# Patient Record
Sex: Female | Born: 1990
Health system: Southern US, Community
[De-identification: ages and names within clinical notes are randomized; demographics above are authoritative.]

## PROBLEM LIST (undated history)

## (undated) DIAGNOSIS — O039 Complete or unspecified spontaneous abortion without complication: Secondary | ICD-10-CM

## (undated) DIAGNOSIS — Z973 Presence of spectacles and contact lenses: Secondary | ICD-10-CM

## (undated) DIAGNOSIS — IMO0001 Reserved for inherently not codable concepts without codable children: Secondary | ICD-10-CM

## (undated) DIAGNOSIS — K219 Gastro-esophageal reflux disease without esophagitis: Secondary | ICD-10-CM

## (undated) HISTORY — DX: Complete or unspecified spontaneous abortion without complication: O03.9

## (undated) HISTORY — PX: HAND SURGERY: SHX662

## (undated) HISTORY — DX: Reserved for inherently not codable concepts without codable children: IMO0001

---
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
## 2010-08-14 HISTORY — PX: CYST EXCISION: SHX5701

## 2010-11-15 ENCOUNTER — Ambulatory Visit: Payer: Self-pay | Admitting: Specialist

## 2010-11-22 ENCOUNTER — Ambulatory Visit: Payer: Self-pay | Admitting: Specialist

## 2013-09-04 ENCOUNTER — Emergency Department: Payer: Self-pay | Admitting: Emergency Medicine

## 2013-09-04 LAB — CBC WITH DIFFERENTIAL/PLATELET
Basophil #: 0.1 10*3/uL (ref 0.0–0.1)
Basophil %: 1.1 %
Eosinophil #: 0.1 10*3/uL (ref 0.0–0.7)
Eosinophil %: 1.7 %
HCT: 41.7 % (ref 35.0–47.0)
HGB: 13.5 g/dL (ref 12.0–16.0)
Lymphocyte #: 2.3 10*3/uL (ref 1.0–3.6)
Lymphocyte %: 29.4 %
MCH: 28.6 pg (ref 26.0–34.0)
MCHC: 32.4 g/dL (ref 32.0–36.0)
MCV: 88 fL (ref 80–100)
Monocyte #: 0.7 x10 3/mm (ref 0.2–0.9)
Monocyte %: 8.7 %
NEUTROS ABS: 4.7 10*3/uL (ref 1.4–6.5)
NEUTROS PCT: 59.1 %
Platelet: 164 10*3/uL (ref 150–440)
RBC: 4.73 10*6/uL (ref 3.80–5.20)
RDW: 12.9 % (ref 11.5–14.5)
WBC: 7.9 10*3/uL (ref 3.6–11.0)

## 2013-09-04 LAB — URINALYSIS, COMPLETE
Bilirubin,UR: NEGATIVE
Glucose,UR: NEGATIVE mg/dL (ref 0–75)
KETONE: NEGATIVE
Nitrite: NEGATIVE
Ph: 6 (ref 4.5–8.0)
RBC,UR: 43 /HPF (ref 0–5)
Specific Gravity: 1.02 (ref 1.003–1.030)

## 2014-02-27 ENCOUNTER — Ambulatory Visit: Payer: Self-pay | Admitting: Physician Assistant

## 2014-03-14 ENCOUNTER — Emergency Department: Payer: Self-pay | Admitting: Emergency Medicine

## 2014-05-09 ENCOUNTER — Emergency Department: Payer: Self-pay | Admitting: Emergency Medicine

## 2015-07-26 ENCOUNTER — Encounter: Payer: Self-pay | Admitting: Emergency Medicine

## 2015-07-26 ENCOUNTER — Ambulatory Visit
Admission: EM | Admit: 2015-07-26 | Discharge: 2015-07-26 | Disposition: A | Discharge status: Home / Self Care | Payer: BLUE CROSS/BLUE SHIELD | Attending: Family Medicine | Admitting: Family Medicine

## 2015-07-26 DIAGNOSIS — S0501XA Injury of conjunctiva and corneal abrasion without foreign body, right eye, initial encounter: Secondary | ICD-10-CM

## 2015-07-26 DIAGNOSIS — H109 Unspecified conjunctivitis: Secondary | ICD-10-CM

## 2015-07-26 MED ORDER — FLUORESCEIN SODIUM 1 MG OP STRP: 1.0000 | ORAL_STRIP | Freq: Once | OPHTHALMIC | Status: DC

## 2015-07-26 MED ORDER — TETRACAINE HCL 0.5 % OP SOLN: 2.0000 [drp] | Freq: Once | OPHTHALMIC | Status: DC

## 2015-07-26 MED ORDER — ERYTHROMYCIN 5 MG/GM OP OINT: 1.0000 "application " | TOPICAL_OINTMENT | Freq: Four times a day (QID) | OPHTHALMIC | Status: DC

## 2015-07-26 NOTE — ED Provider Notes (Signed)
[facility]  ____________________________________________  Time seen: Approximately 3:46 PM  I have reviewed the triage vital signs and the nursing notes.   HISTORY  Chief Complaint Eye Drainage   HPI Summer Powers is a 24 y.o. femalepresents for complaints of right eye redness, drainage and itching x two days. States works in [employer], [employer] and frequently exposed to sick patients. States "I think I have pink eye."   Reports woke up with some redness and itching yesterday morning that gradually increased and reports last night went to take contact out, and states after taking contact out had some eye pain. States unsure if she scratched eye with nails or not. States no vision changes. States uses contacts and states when contact still in place, vision was normal. Reports does not have glasses with her at this time. Reports woke up this am with greenish right eye crusting and drainage with some matting. States intermittent drainage throughout the day today.   States current right eye irritation is 3/10. Denies other pain or discomfort. States some light sensitivity. Denies chemical exposure, foreign bodies, or injury. Denies recent sickness.    History reviewed. No pertinent past medical history.  There are no active problems to display for this patient.   Past Surgical History  Procedure Laterality Date  . Hand surgery Right    LMP: 1 week ago; denies chance of pregnancy.  Reports tetanus immunization is up to date.   Current Outpatient Rx  Name  Route  Sig  Dispense  Refill  .             Allergies Review of patient's allergies indicates not on file.  History reviewed. No pertinent family history.  Social History Social History  Substance Use Topics  . Smoking status: Current Every Day Smoker    Types: Cigarettes  . Smokeless tobacco: None  . Alcohol Use: Yes    Review of Systems Constitutional: No fever/chills Eyes: No visual changes. Right eye redness  and irritation with drainage.  ENT: No sore throat. Cardiovascular: Denies chest pain. Respiratory: Denies shortness of breath. Gastrointestinal: No abdominal pain.  No nausea, no vomiting.  No diarrhea.  No constipation. Genitourinary: Negative for dysuria. Musculoskeletal: Negative for back pain. Skin: Negative for rash. Neurological: Negative for headaches, focal weakness or numbness.  10-point ROS otherwise negative.  ____________________________________________   PHYSICAL EXAM:  VITAL SIGNS: ED Triage Vitals  Enc Vitals Group     BP 07/26/15 1424 114/79 mmHg     Pulse Rate 07/26/15 1424 72     Resp 07/26/15 1424 16     Temp 07/26/15 1424 97.7 F (36.5 C)     Temp Source 07/26/15 1424 Tympanic     SpO2 07/26/15 1424 100 %     Weight 07/26/15 1424 168 lb (76.204 kg)     Height 07/26/15 1424 5\' 2"  (1.575 m)     Head Cir --      Peak Flow --      Pain Score 07/26/15 1427 3     Pain Loc --      Pain Edu? --      Excl. in GC? --    Visual acuity: Right: 20/200 without correction            Left: 20/25 with correction.   Constitutional: Alert and oriented. Well appearing and in no acute distress. Eyes: PERRL. EOMI. No globe trauma; Eyelids normal to inspection; Everted for exam right, no foreign bodies visualized; Right conjunctiva mild  injection;  left conjunctiva normal; Right mild greenish exudate noted, mild greenish crusting present;  Examined with fluorescein right with corneal abrasion noted; EOM's intact; Pupils PERRLA; Anterior Chambers normal with limited exam.  Head: Atraumatic. No erythema, no swelling.   Ears: no erythema, normal TMs bilaterally.   Nose: No congestion/rhinnorhea.  Mouth/Throat: Mucous membranes are moist.  Oropharynx non-erythematous. Neck: No stridor.  No cervical spine tenderness to palpation. Hematological/Lymphatic/Immunilogical: No cervical lymphadenopathy. Cardiovascular: Normal rate, regular rhythm. Grossly normal heart sounds.  Good  peripheral circulation. Respiratory: Normal respiratory effort.  No retractions. Lungs CTAB. Gastrointestinal: Soft and nontender.  Musculoskeletal: No lower or upper extremity tenderness nor edema.   Neurologic:  Normal speech and language. No gross focal neurologic deficits are appreciated. No gait instability. Skin:  Skin is warm, dry and intact. No rash noted. Long finger nails.  Psychiatric: Mood and affect are normal. Speech and behavior are normal.  ____________________________________________   LABS (all labs ordered are listed, but only abnormal results are displayed)  Labs Reviewed - No data to display  PROCEDURES  Procedure(s) performed:   Eye exam Procedure explained and verbal consent obtained.  Anesthesia: tetracaine ophthalmic 2 drops Right eye examined with fluorescein strip.  No foreign bodies visualized. Right small corneal abrasion noted at 11 o'clock. No visualized foreign bodies, no rust ring visualized.  Sterile swab for blind sweep utilized,  no foreign bodies found.  Patient tolerated well.   ____________________________________________   INITIAL IMPRESSION / ASSESSMENT AND PLAN / ED COURSE   Pertinent labs & imaging results that were available during my care of the patient were reviewed by me and considered in my medical decision making (see chart for details).  Very well appearing patient, no acute distress. 2 days of right eye redness and itching with drainage. Denies known trigger, injury, chemicals or foreign bodies. States irritation increased post removing contact last night. Suspect right eye conjunctivitis with secondary corneal abrasion from removing contact. Will treat with erythromycin ophthalmic ointment, hand hygiene and close follow up. Counseled no contact use. Counseled follow up with opthalmology tomorrow. States she follows with Dr Clearance Coots and will follow up tomorrow. On call Dr. Druscilla Brownie information also given.   Discussed follow up with  Primary care physician this week. Discussed follow up and return parameters to Urgent care or ER including vision changes, pain, no resolution or any worsening concerns. Patient verbalized understanding and agreed to plan.   ____________________________________________   FINAL CLINICAL IMPRESSION(S) / ED DIAGNOSES  Final diagnoses:  Right conjunctivitis  Corneal abrasion, right, initial encounter     Renford Dills, NP 07/26/15 1828

## 2015-07-26 NOTE — Discharge Instructions (Signed)
Use medication as prescribed. Avoid rubbing or scratching eye. Do not use contact until resolved. Use good hand hygiene.   Follow up with your ophthalmologist or the above tomorrow. Return to Urgent care or go to ER for eye pain, vision changes, new or worsening concerns.   Corneal Abrasion The cornea is the clear covering at the front and center of the eye. When looking at the colored portion of the eye (iris), you are looking through the cornea. This very thin tissue is made up of many layers. The surface layer is a single layer of cells (corneal epithelium) and is one of the most sensitive tissues in the body. If a scratch or injury causes the corneal epithelium to come off, it is called a corneal abrasion. If the injury extends to the tissues below the epithelium, the condition is called a corneal ulcer. CAUSES   Scratches.  Trauma.  Foreign body in the eye. Some people have recurrences of abrasions in the area of the original injury even after it has healed (recurrent erosion syndrome). Recurrent erosion syndrome generally improves and goes away with time. SYMPTOMS   Eye pain.  Difficulty or inability to keep the injured eye open.  The eye becomes very sensitive to light.  Recurrent erosions tend to happen suddenly, first thing in the morning, usually after waking up and opening the eye. DIAGNOSIS  Your health care provider can diagnose a corneal abrasion during an eye exam. Dye is usually placed in the eye using a drop or a small paper strip moistened by your tears. When the eye is examined with a special light, the abrasion shows up clearly because of the dye. TREATMENT   Small abrasions may be treated with antibiotic drops or ointment alone.  A pressure patch may be put over the eye. If this is done, follow your doctor's instructions for when to remove the patch. Do not drive or use machines while the eye patch is on. Judging distances is hard to do with a patch on. If the  abrasion becomes infected and spreads to the deeper tissues of the cornea, a corneal ulcer can result. This is serious because it can cause corneal scarring. Corneal scars interfere with light passing through the cornea and cause a loss of vision in the involved eye. HOME CARE INSTRUCTIONS  Use medicine or ointment as directed. Only take over-the-counter or prescription medicines for pain, discomfort, or fever as directed by your health care provider.  Do not drive or operate machinery if your eye is patched. Your ability to judge distances is impaired.  If your health care provider has given you a follow-up appointment, it is very important to keep that appointment. Not keeping the appointment could result in a severe eye infection or permanent loss of vision. If there is any problem keeping the appointment, let your health care provider know. SEEK MEDICAL CARE IF:   You have pain, light sensitivity, and a scratchy feeling in one eye or both eyes.  Your pressure patch keeps loosening up, and you can blink your eye under the patch after treatment.  Any kind of discharge develops from the eye after treatment or if the lids stick together in the morning.  You have the same symptoms in the morning as you did with the original abrasion days, weeks, or months after the abrasion healed.   This information is not intended to replace advice given to you by your health care provider. Make sure you discuss any questions you have  with your health care provider.   Document Released: 07/28/2000 Document Revised: 04/21/2015 Document Reviewed: 04/07/2013 Elsevier Interactive Patient Education 2016 Elsevier Inc.  Bacterial Conjunctivitis Bacterial conjunctivitis, commonly called pink eye, is an inflammation of the clear membrane that covers the white part of the eye (conjunctiva). The inflammation can also happen on the underside of the eyelids. The blood vessels in the conjunctiva become inflamed, causing  the eye to become red or pink. Bacterial conjunctivitis may spread easily from one eye to another and from person to person (contagious).  CAUSES  Bacterial conjunctivitis is caused by bacteria. The bacteria may come from your own skin, your upper respiratory tract, or from someone else with bacterial conjunctivitis. SYMPTOMS  The normally white color of the eye or the underside of the eyelid is usually pink or red. The pink eye is usually associated with irritation, tearing, and some sensitivity to light. Bacterial conjunctivitis is often associated with a thick, yellowish discharge from the eye. The discharge may turn into a crust on the eyelids overnight, which causes your eyelids to stick together. If a discharge is present, there may also be some blurred vision in the affected eye. DIAGNOSIS  Bacterial conjunctivitis is diagnosed by your caregiver through an eye exam and the symptoms that you report. Your caregiver looks for changes in the surface tissues of your eyes, which may point to the specific type of conjunctivitis. A sample of any discharge may be collected on a cotton-tip swab if you have a severe case of conjunctivitis, if your cornea is affected, or if you keep getting repeat infections that do not respond to treatment. The sample will be sent to a lab to see if the inflammation is caused by a bacterial infection and to see if the infection will respond to antibiotic medicines. TREATMENT   Bacterial conjunctivitis is treated with antibiotics. Antibiotic eyedrops are most often used. However, antibiotic ointments are also available. Antibiotics pills are sometimes used. Artificial tears or eye washes may ease discomfort. HOME CARE INSTRUCTIONS   To ease discomfort, apply a cool, clean washcloth to your eye for 10-20 minutes, 3-4 times a day.  Gently wipe away any drainage from your eye with a warm, wet washcloth or a cotton ball.  Wash your hands often with soap and water. Use paper  towels to dry your hands.  Do not share towels or washcloths. This may spread the infection.  Change or wash your pillowcase every day.  You should not use eye makeup until the infection is gone.  Do not operate machinery or drive if your vision is blurred.  Stop using contact lenses. Ask your caregiver how to sterilize or replace your contacts before using them again. This depends on the type of contact lenses that you use.  When applying medicine to the infected eye, do not touch the edge of your eyelid with the eyedrop bottle or ointment tube. SEEK IMMEDIATE MEDICAL CARE IF:   Your infection has not improved within 3 days after beginning treatment.  You had yellow discharge from your eye and it returns.  You have increased eye pain.  Your eye redness is spreading.  Your vision becomes blurred.  You have a fever or persistent symptoms for more than 2-3 days.  You have a fever and your symptoms suddenly get worse.  You have facial pain, redness, or swelling. MAKE SURE YOU:   Understand these instructions.  Will watch your condition.  Will get help right away if you are not doing  well or get worse.   This information is not intended to replace advice given to you by your health care provider. Make sure you discuss any questions you have with your health care provider.   Document Released: 07/31/2005 Document Revised: 08/21/2014 Document Reviewed: 01/01/2012 Elsevier Interactive Patient Education [employer]2016 Elsevier Inc.

## 2015-07-26 NOTE — ED Notes (Signed)
Patient c/o redness and drainage from her right eye since yesterday.

## 2015-08-16 LAB — HM PAP SMEAR: HM Pap smear: NORMAL

## 2015-10-07 ENCOUNTER — Encounter: Payer: Self-pay | Admitting: Emergency Medicine

## 2015-10-07 ENCOUNTER — Emergency Department
Admission: EM | Admit: 2015-10-07 | Discharge: 2015-10-07 | Disposition: A | Discharge status: Home / Self Care | Payer: BLUE CROSS/BLUE SHIELD | Attending: Emergency Medicine | Admitting: Emergency Medicine

## 2015-10-07 DIAGNOSIS — J069 Acute upper respiratory infection, unspecified: Secondary | ICD-10-CM | POA: Insufficient documentation

## 2015-10-07 DIAGNOSIS — F17210 Nicotine dependence, cigarettes, uncomplicated: Secondary | ICD-10-CM | POA: Diagnosis not present

## 2015-10-07 DIAGNOSIS — R51 Headache: Secondary | ICD-10-CM | POA: Diagnosis present

## 2015-10-07 LAB — RAPID INFLUENZA A&B ANTIGENS
Influenza A (ARMC): NOT DETECTED
Influenza B (ARMC): NOT DETECTED

## 2015-10-07 MED ORDER — IBUPROFEN 400 MG PO TABS
400.0000 mg | ORAL_TABLET | Freq: Once | ORAL | Status: AC
  Administered 2015-10-07: 400 mg via ORAL
  Filled 2015-10-07: qty 1

## 2015-10-07 MED ORDER — HYDROCOD POLST-CPM POLST ER 10-8 MG/5ML PO SUER: 5.0000 mL | Freq: Two times a day (BID) | ORAL | Status: DC

## 2015-10-07 NOTE — ED Notes (Signed)
Patient reports chills, headache and generalized body aches that started yesterday. Patient reports that she had been exposed to the flu.

## 2015-10-07 NOTE — ED Provider Notes (Signed)
[facility] Emergency Department Provider Note     Time seen: ----------------------------------------- 8:45 PM on 10/07/2015 -----------------------------------------    I have reviewed the triage vital signs and the nursing notes.   HISTORY  Chief Complaint Chills; Headache; and Generalized Body Aches    HPI Summer Powers is a 25 y.o. female who presents ER with chills, headaches, sore throat and congestion that started yesterday. Patient thinks she was exposed to flu through her work. She denies any nausea vomiting or diarrhea. Nothing makes it better or worse.   History reviewed. No pertinent past medical history.  There are no active problems to display for this patient.   Past Surgical History  Procedure Laterality Date  . Hand surgery Right     Allergies Review of patient's allergies indicates no known allergies.  Social History Social History  Substance Use Topics  . Smoking status: Current Every Day Smoker -- 1.00 packs/day for 3 years    Types: Cigarettes  . Smokeless tobacco: None  . Alcohol Use: Yes     Comment: occasionally    Review of Systems Constitutional: Negative for fever. Positive for chills Eyes: Negative for visual changes. ENT:  Positive for sore throat and congestion Cardiovascular: Negative for chest pain. Respiratory: Negative for shortness of breath. Gastrointestinal: Negative for abdominal pain, vomiting and diarrhea. Genitourinary: Negative for dysuria. Musculoskeletal: Negative for back pain. Skin: Negative for rash. Neurological: As if her headache  10-point ROS otherwise negative.  ____________________________________________   PHYSICAL EXAM:  VITAL SIGNS: ED Triage Vitals  Enc Vitals Group     BP 10/07/15 2005 121/69 mmHg     Pulse Rate 10/07/15 2005 91     Resp 10/07/15 2005 18     Temp 10/07/15 2005 98.2 F (36.8 C)     Temp Source 10/07/15 2005 Oral     SpO2 10/07/15 2005 98 %   Weight 10/07/15 2005 168 lb (76.204 kg)     Height 10/07/15 2005  (1.575 m)     Head Cir --      Peak Flow --      Pain Score 10/07/15 2005 5     Pain Loc --      Pain Edu? --      Excl. in GC? --     Constitutional: Alert and oriented. Well appearing and in no distress. Eyes: Conjunctivae are normal. PERRL. Normal extraocular movements. ENT   Head: Normocephalic and atraumatic.   Nose: No congestion/rhinnorhea.   Mouth/Throat: Mucous membranes are moist.   Neck: No stridor. Cardiovascular: Normal rate, regular rhythm. Normal and symmetric distal pulses are present in all extremities. No murmurs, rubs, or gallops. Respiratory: Normal respiratory effort without tachypnea nor retractions. Breath sounds are clear and equal bilaterally. No wheezes/rales/rhonchi. Gastrointestinal: Soft and nontender. No distention. No abdominal bruits.  Musculoskeletal: Nontender with normal range of motion in all extremities. No joint effusions.  No lower extremity tenderness nor edema. Neurologic:  Normal speech and language. No gross focal neurologic deficits are appreciated. Speech is normal. No gait instability. Skin:  Skin is warm, dry and intact. No rash noted. Psychiatric: Mood and affect are normal. Speech and behavior are normal. Patient exhibits appropriate insight and judgment. ____________________________________________  ED COURSE:  Pertinent labs & imaging results that were available during my care of the patient were reviewed by me and considered in my medical decision making (see chart for details). Clinically patient is doubtful for the flu, will obtain a flu swab and reevaluate.  ____________________________________________    LABS (pertinent positives/negatives)  Labs Reviewed  RAPID INFLUENZA A&B ANTIGENS ([facility] ONLY)   ____________________________________________  FINAL ASSESSMENT AND PLAN  URI  Plan: Patient with labs and imaging as dictated above. Patient  be discharged with decongestants and cough medicine and encouraged to have close follow-up with her doctor for reevaluation.   Emily Filbert, MD   Emily Filbert, MD 10/07/15 [phone]

## 2015-10-07 NOTE — ED Notes (Signed)
Pt. Going home by self. 

## 2015-10-07 NOTE — Discharge Instructions (Signed)
Upper Respiratory Infection, Adult Most upper respiratory infections (URIs) are a viral infection of the air passages leading to the lungs. A URI affects the nose, throat, and upper air passages. The most common type of URI is nasopharyngitis and is typically referred to as "the common cold." URIs run their course and usually go away on their own. Most of the time, a URI does not require medical attention, but sometimes a bacterial infection in the upper airways can follow a viral infection. This is called a secondary infection. Sinus and middle ear infections are common types of secondary upper respiratory infections. Bacterial pneumonia can also complicate a URI. A URI can worsen asthma and chronic obstructive pulmonary disease (COPD). Sometimes, these complications can require emergency medical care and may be life threatening.  CAUSES Almost all URIs are caused by viruses. A virus is a type of germ and can spread from one person to another.  RISKS FACTORS You may be at risk for a URI if:   You smoke.   You have chronic heart or lung disease.  You have a weakened defense (immune) system.   You are very young or very old.   You have nasal allergies or asthma.  You work in crowded or poorly ventilated areas.  You work in health care facilities or schools. SIGNS AND SYMPTOMS  Symptoms typically develop 2-3 days after you come in contact with a cold virus. Most viral URIs last 7-10 days. However, viral URIs from the influenza virus (flu virus) can last 14-18 days and are typically more severe. Symptoms may include:   Runny or stuffy (congested) nose.   Sneezing.   Cough.   Sore throat.   Headache.   Fatigue.   Fever.   Loss of appetite.   Pain in your forehead, behind your eyes, and over your cheekbones (sinus pain).  Muscle aches.  DIAGNOSIS  Your health care provider may diagnose a URI by:  Physical exam.  Tests to check that your symptoms are not due to  another condition such as:  Strep throat.  Sinusitis.  Pneumonia.  Asthma. TREATMENT  A URI goes away on its own with time. It cannot be cured with medicines, but medicines may be prescribed or recommended to relieve symptoms. Medicines may help:  Reduce your fever.  Reduce your cough.  Relieve nasal congestion. HOME CARE INSTRUCTIONS   Take medicines only as directed by your health care provider.   Gargle warm saltwater or take cough drops to comfort your throat as directed by your health care provider.  Use a warm mist humidifier or inhale steam from a shower to increase air moisture. This may make it easier to breathe.  Drink enough fluid to keep your urine clear or pale yellow.   Eat soups and other clear broths and maintain good nutrition.   Rest as needed.   Return to work when your temperature has returned to normal or as your health care provider advises. You may need to stay home longer to avoid infecting others. You can also use a face mask and careful hand washing to prevent spread of the virus.  Increase the usage of your inhaler if you have asthma.   Do not use any tobacco products, including cigarettes, chewing tobacco, or electronic cigarettes. If you need help quitting, ask your health care provider. PREVENTION  The best way to protect yourself from getting a cold is to practice good hygiene.   Avoid oral or hand contact with people with cold   symptoms.   Wash your hands often if contact occurs.  There is no clear evidence that vitamin C, vitamin E, echinacea, or exercise reduces the chance of developing a cold. However, it is always recommended to get plenty of rest, exercise, and practice good nutrition.  SEEK MEDICAL CARE IF:   You are getting worse rather than better.   Your symptoms are not controlled by medicine.   You have chills.  You have worsening shortness of breath.  You have brown or red mucus.  You have yellow or brown nasal  discharge.  You have pain in your face, especially when you bend forward.  You have a fever.  You have swollen neck glands.  You have pain while swallowing.  You have white areas in the back of your throat. SEEK IMMEDIATE MEDICAL CARE IF:   You have severe or persistent:  Headache.  Ear pain.  Sinus pain.  Chest pain.  You have chronic lung disease and any of the following:  Wheezing.  Prolonged cough.  Coughing up blood.  A change in your usual mucus.  You have a stiff neck.  You have changes in your:  Vision.  Hearing.  Thinking.  Mood. MAKE SURE YOU:   Understand these instructions.  Will watch your condition.  Will get help right away if you are not doing well or get worse.   This information is not intended to replace advice given to you by your health care provider. Make sure you discuss any questions you have with your health care provider.   Document Released: 01/24/2001 Document Revised: 12/15/2014 Document Reviewed: 11/05/2013 Elsevier Interactive Patient Education 2016 Elsevier Inc.  

## 2015-10-07 NOTE — ED Notes (Signed)
Pt. States body aches and congestion for 2 days.  Pt. States no one is sick at home.

## 2016-01-24 ENCOUNTER — Encounter: Payer: Self-pay | Admitting: Emergency Medicine

## 2016-01-24 ENCOUNTER — Emergency Department: Payer: Medicaid Other

## 2016-01-24 ENCOUNTER — Emergency Department
Admission: EM | Admit: 2016-01-24 | Discharge: 2016-01-24 | Disposition: A | Discharge status: Home / Self Care | Payer: Medicaid Other | Attending: Emergency Medicine | Admitting: Emergency Medicine

## 2016-01-24 DIAGNOSIS — O209 Hemorrhage in early pregnancy, unspecified: Secondary | ICD-10-CM | POA: Diagnosis present

## 2016-01-24 DIAGNOSIS — Z3A08 8 weeks gestation of pregnancy: Secondary | ICD-10-CM | POA: Diagnosis not present

## 2016-01-24 DIAGNOSIS — Z79899 Other long term (current) drug therapy: Secondary | ICD-10-CM | POA: Diagnosis not present

## 2016-01-24 DIAGNOSIS — O200 Threatened abortion: Secondary | ICD-10-CM | POA: Insufficient documentation

## 2016-01-24 DIAGNOSIS — F17210 Nicotine dependence, cigarettes, uncomplicated: Secondary | ICD-10-CM | POA: Insufficient documentation

## 2016-01-24 DIAGNOSIS — Z792 Long term (current) use of antibiotics: Secondary | ICD-10-CM | POA: Diagnosis not present

## 2016-01-24 LAB — HCG, QUANTITATIVE, PREGNANCY: HCG, BETA CHAIN, QUANT, S: 296420 m[IU]/mL — AB (ref <5)

## 2016-01-24 LAB — POCT PREGNANCY, URINE: Preg Test, Ur: POSITIVE — AB

## 2016-01-24 LAB — ABO/RH: ABO/RH(D): A POS

## 2016-01-24 NOTE — ED Notes (Signed)
AAOx3.  Skin warm and dry. Ambulates with easy and steady gait. 

## 2016-01-24 NOTE — ED Notes (Signed)
Pt states she is eight weeks pregnant and has had some spotting. 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 Pt states she has to lift pts that lift 300+ lbs. Denies any abd cramping but has some lower back pain.

## 2016-01-24 NOTE — ED Provider Notes (Signed)
[facility] Emergency Department Provider Note  Time seen: 3:25 PM  I have reviewed the triage vital signs and the nursing notes.   HISTORY  Chief Complaint Vaginal Bleeding    HPI Summer Powers is a 25 y.o. female G1 P0 with no past medical history presents the emergency department for vaginal spotting. According to the patient she believes she is about 7 or [redacted] weeks pregnant based on her last menstrual period. The patient found out she was pregnant [redacted] weeks ago. 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 States today she has noted some very light spotting date after she uses the bathroom when she wipes she will have a very small amount of pink tinge on the toilet paper, she became concerned so she came to the department for evaluation. Denies any abdominal pain. Does state mild back pain but states that is fairly typical for her. Denies any dysuria, nausea, vomiting, diarrhea or fever. Describes the bleeding is very minimal/spotting.     History reviewed. No pertinent past medical history.  There are no active problems to display for this patient.   Past Surgical History  Procedure Laterality Date  . Hand surgery Right     Current Outpatient Rx  Name  Route  Sig  Dispense  Refill  . chlorpheniramine-HYDROcodone (TUSSIONEX PENNKINETIC ER) 10-8 MG/5ML SUER   Oral   Take 5 mLs by mouth 2 (two) times daily.   140 mL   0   . erythromycin ophthalmic ointment   Right Eye   Place 1 application into the right eye 4 (four) times daily. For seven days   3.5 g   0     Allergies Review of patient's allergies indicates no known allergies.  No family history on file.  Social History Social History  Substance Use Topics  . Smoking status: Current Every Day Smoker -- 1.00 packs/day for 3 years    Types: Cigarettes  . Smokeless tobacco: None  . Alcohol Use: Yes     Comment: occasionally    Review of Systems Constitutional:  Negative for fever. Cardiovascular: Negative for chest pain. Respiratory: Negative for shortness of breath. Gastrointestinal: Negative for abdominal pain Musculoskeletal: Negative for back pain. Neurological: Negative for headache 10-point ROS otherwise negative.  ____________________________________________   PHYSICAL EXAM:  VITAL SIGNS: ED Triage Vitals  Enc Vitals Group     BP 01/24/16 1304 121/72 mmHg     Pulse Rate 01/24/16 1304 85     Resp 01/24/16 1304 20     Temp 01/24/16 1304 98.2 F (36.8 C)     Temp Source 01/24/16 1304 Oral     SpO2 01/24/16 1304 100 %     Weight --      Height --      Head Cir --      Peak Flow --      Pain Score 01/24/16 1304 5     Pain Loc --      Pain Edu? --      Excl. in GC? --     Constitutional: Alert and oriented. Well appearing and in no distress. Eyes: Normal exam ENT   Head: Normocephalic and atraumatic   Mouth/Throat: Mucous membranes are moist. Cardiovascular: Normal rate, regular rhythm. No murmur Respiratory: Normal respiratory effort without tachypnea nor retractions. Breath sounds are clear  Gastrointestinal: Soft and nontender. No distention.   Musculoskeletal: Nontender with normal range of motion in all extremities Neurologic:  Normal  speech and language. No gross focal neurologic deficits Skin:  Skin is warm, dry and intact.  Psychiatric: Mood and affect are normal.   ____________________________________________    RADIOLOGY  Ultrasound consistent with 8 week 1 day IUP.  ____________________________________________    INITIAL IMPRESSION / ASSESSMENT AND PLAN / ED COURSE  Pertinent labs & imaging results that were available during my care of the patient were reviewed by me and considered in my medical decision making (see chart for details).  The patient presents the emergency department vaginal spotting. States she is pregnant 7 or [redacted] weeks pregnant based on her last menstrual period. Denies any  abdominal pain or pelvic pain. We will check labs, proceed with an ultrasound, and closely monitor in the emergency department.  Ultrasound consistent with a week 1 day IUP. We'll discharge with OB follow-up. Patient points follow up with [facility].  ____________________________________________   FINAL CLINICAL IMPRESSION(S) / ED DIAGNOSES  Threatened miscarriage   Minna AntisKevin Kayra Crowell, MD 01/24/16 [phone]

## 2016-01-24 NOTE — Discharge Instructions (Signed)

## 2016-08-02 LAB — HM PAP SMEAR: HM Pap smear: NEGATIVE

## 2016-08-03 ENCOUNTER — Other Ambulatory Visit: Payer: Self-pay | Admitting: Advanced Practice Midwife

## 2016-08-03 DIAGNOSIS — Z369 Encounter for antenatal screening, unspecified: Secondary | ICD-10-CM

## 2016-08-16 DIAGNOSIS — Z315 Encounter for genetic counseling: Secondary | ICD-10-CM

## 2016-08-16 HISTORY — DX: Encounter for procreative genetic counseling: Z31.5

## 2016-09-18 ENCOUNTER — Ambulatory Visit: Payer: BLUE CROSS/BLUE SHIELD

## 2017-03-22 DIAGNOSIS — Z418 Encounter for other procedures for purposes other than remedying health state: Secondary | ICD-10-CM | POA: Diagnosis not present

## 2017-03-22 DIAGNOSIS — Z113 Encounter for screening for infections with a predominantly sexual mode of transmission: Secondary | ICD-10-CM | POA: Diagnosis not present

## 2017-09-28 ENCOUNTER — Emergency Department
Admission: EM | Admit: 2017-09-28 | Discharge: 2017-09-28 | Discharge status: Against Medical Advice | Payer: BLUE CROSS/BLUE SHIELD

## 2017-09-28 NOTE — ED Triage Notes (Signed)
FIRST NURSE NOTE:  Pt with c/o vaginal bleeding and states "I think I might be pregnant."

## 2017-10-05 ENCOUNTER — Encounter: Payer: Self-pay | Admitting: Advanced Practice Midwife

## 2017-10-05 ENCOUNTER — Ambulatory Visit (INDEPENDENT_AMBULATORY_CARE_PROVIDER_SITE_OTHER): Payer: 59 | Admitting: Advanced Practice Midwife

## 2017-10-05 VITALS — BP 124/70 | Wt 173.0 lb

## 2017-10-05 DIAGNOSIS — O99210 Obesity complicating pregnancy, unspecified trimester: Secondary | ICD-10-CM | POA: Insufficient documentation

## 2017-10-05 DIAGNOSIS — O0990 Supervision of high risk pregnancy, unspecified, unspecified trimester: Secondary | ICD-10-CM | POA: Diagnosis not present

## 2017-10-05 DIAGNOSIS — Z6832 Body mass index (BMI) 32.0-32.9, adult: Secondary | ICD-10-CM

## 2017-10-05 DIAGNOSIS — N912 Amenorrhea, unspecified: Secondary | ICD-10-CM | POA: Diagnosis not present

## 2017-10-05 DIAGNOSIS — Z113 Encounter for screening for infections with a predominantly sexual mode of transmission: Secondary | ICD-10-CM | POA: Diagnosis not present

## 2017-10-05 DIAGNOSIS — Z131 Encounter for screening for diabetes mellitus: Secondary | ICD-10-CM

## 2017-10-05 DIAGNOSIS — Z1379 Encounter for other screening for genetic and chromosomal anomalies: Secondary | ICD-10-CM

## 2017-10-05 NOTE — Patient Instructions (Signed)
Prenatal Care WHAT IS PRENATAL CARE? Prenatal care is the process of caring for a pregnant woman before she gives birth. Prenatal care makes sure that she and her baby remain as healthy as possible throughout pregnancy. Prenatal care may be provided by a midwife, family practice health care provider, or a childbirth and pregnancy specialist (obstetrician). Prenatal care may include physical examinations, testing, treatments, and education on nutrition, lifestyle, and social support services. WHY IS PRENATAL CARE SO IMPORTANT? Early and consistent prenatal care increases the chance that you and your baby will remain healthy throughout your pregnancy. This type of care also decreases a baby's risk of being born too early (prematurely), or being born smaller than expected (small for gestational age). Any underlying medical conditions you may have that could pose a risk during your pregnancy are discussed during prenatal care visits. You will also be monitored regularly for any new conditions that may arise during your pregnancy so they can be treated quickly and effectively. WHAT HAPPENS DURING PRENATAL CARE VISITS? Prenatal care visits may include the following: Discussion Tell your health care provider about any new signs or symptoms you have experienced since your last visit. These might include:  Nausea or vomiting.  Increased or decreased level of energy.  Difficulty sleeping.  Back or leg pain.  Weight changes.  Frequent urination.  Shortness of breath with physical activity.  Changes in your skin, such as the development of a rash or itchiness.  Vaginal discharge or bleeding.  Feelings of excitement or nervousness.  Changes in your baby's movements.  You may want to write down any questions or topics you want to discuss with your health care provider and bring them with you to your appointment. Examination During your first prenatal care visit, you will likely have a complete  physical exam. Your health care provider will often examine your vagina, cervix, and the position of your uterus, as well as check your heart, lungs, and other body systems. As your pregnancy progresses, your health care provider will measure the size of your uterus and your baby's position inside your uterus. He or she may also examine you for early signs of labor. Your prenatal visits may also include checking your blood pressure and, after about 10-12 weeks of pregnancy, listening to your baby's heartbeat. Testing Regular testing often includes:  Urinalysis. This checks your urine for glucose, protein, or signs of infection.  Blood count. This checks the levels of white and red blood cells in your body.  Tests for sexually transmitted infections (STIs). Testing for STIs at the beginning of pregnancy is routinely done and is required in many states.  Antibody testing. You will be checked to see if you are immune to certain illnesses, such as rubella, that can affect a developing fetus.  Glucose screen. Around 24-28 weeks of pregnancy, your blood glucose level will be checked for signs of gestational diabetes. Follow-up tests may be recommended.  Group B strep. This is a bacteria that is commonly found inside a woman's vagina. This test will inform your health care provider if you need an antibiotic to reduce the amount of this bacteria in your body prior to labor and childbirth.  Ultrasound. Many pregnant women undergo an ultrasound screening around 18-20 weeks of pregnancy to evaluate the health of the fetus and check for any developmental abnormalities.  HIV (human immunodeficiency virus) testing. Early in your pregnancy, you will be screened for HIV. If you are at high risk for HIV, this test may   be repeated during your third trimester of pregnancy.  You may be offered other testing based on your age, personal or family medical history, or other factors. HOW OFTEN SHOULD I PLAN TO SEE MY  HEALTH CARE PROVIDER FOR PRENATAL CARE? Your prenatal care check-up schedule depends on any medical conditions you have before, or develop during, your pregnancy. If you do not have any underlying medical conditions, you will likely be seen for checkups:  Monthly, during the first 6 months of pregnancy.  Twice a month during months 7 and 8 of pregnancy.  Weekly starting in the 9th month of pregnancy and until delivery.  If you develop signs of early labor or other concerning signs or symptoms, you may need to see your health care provider more often. Ask your health care provider what prenatal care schedule is best for you. WHAT CAN I DO TO KEEP MYSELF AND MY BABY AS HEALTHY AS POSSIBLE DURING MY PREGNANCY?  Take a prenatal vitamin containing 400 micrograms (0.4 mg) of folic acid every day. Your health care provider may also ask you to take additional vitamins such as iodine, vitamin D, iron, copper, and zinc.  Take 1500-2000 mg of calcium daily starting at your 20th week of pregnancy until you deliver your baby.  Make sure you are up to date on your vaccinations. Unless directed otherwise by your health care provider: ? You should receive a tetanus, diphtheria, and pertussis (Tdap) vaccination between the 27th and 36th week of your pregnancy, regardless of when your last Tdap immunization occurred. This helps protect your baby from whooping cough (pertussis) after he or she is born. ? You should receive an annual inactivated influenza vaccine (IIV) to help protect you and your baby from influenza. This can be done at any point during your pregnancy.  Eat a well-rounded diet that includes: ? Fresh fruits and vegetables. ? Lean proteins. ? Calcium-rich foods such as milk, yogurt, hard cheeses, and dark, leafy greens. ? Whole grain breads.  Do noteat seafood high in mercury, including: ? Swordfish. ? Tilefish. ? Shark. ? King mackerel. ? More than 6 oz tuna per week.  Do not  eat: ? Raw or undercooked meats or eggs. ? Unpasteurized foods, such as soft cheeses (brie, blue, or feta), juices, and milks. ? Lunch meats. ? Hot dogs that have not been heated until they are steaming.  Drink enough water to keep your urine clear or pale yellow. For many women, this may be 10 or more 8 oz glasses of water each day. Keeping yourself hydrated helps deliver nutrients to your baby and may prevent the start of pre-term uterine contractions.  Do not use any tobacco products including cigarettes, chewing tobacco, or electronic cigarettes. If you need help quitting, ask your health care provider.  Do not drink beverages containing alcohol. No safe level of alcohol consumption during pregnancy has been determined.  Do not use any illegal drugs. These can harm your developing baby or cause a miscarriage.  Ask your health care provider or pharmacist before taking any prescription or over-the-counter medicines, herbs, or supplements.  Limit your caffeine intake to no more than 200 mg per day.  Exercise. Unless told otherwise by your health care provider, try to get 30 minutes of moderate exercise most days of the week. Do not  do high-impact activities, contact sports, or activities with a high risk of falling, such as horseback riding or downhill skiing.  Get plenty of rest.  Avoid anything that raises your   body temperature, such as hot tubs and saunas.  If you own a cat, do not empty its litter box. Bacteria contained in cat feces can cause an infection called toxoplasmosis. This can result in serious harm to the fetus.  Stay away from chemicals such as insecticides, lead, mercury, and cleaning or paint products that contain solvents.  Do not have any X-rays taken unless medically necessary.  Take a childbirth and breastfeeding preparation class. Ask your health care provider if you need a referral or recommendation.  This information is not intended to replace advice given  to you by your health care provider. Make sure you discuss any questions you have with your health care provider. Document Released: 08/03/2003 Document Revised: 01/03/2016 Document Reviewed: 10/15/2013 Elsevier Interactive Patient Education  2017 [employer] Exercise During Pregnancy For people of all ages, exercise is an important part of being healthy. Exercise improves heart and lung function and helps to maintain strength, flexibility, and a healthy body weight. Exercise also boosts energy levels and elevates mood. For most women, maintaining an exercise routine throughout pregnancy is recommended. It is only on rare occasions and with certain medical conditions or pregnancy complications that women may be asked to limit or avoid exercise during pregnancy. What are some other benefits to exercising during pregnancy? Along with maintaining strength and flexibility, exercising throughout pregnancy can help to:  Keep strength in muscles that are very important during labor and childbirth.  Decrease low back pain during pregnancy.  Decrease the risk of developing gestational diabetes mellitus (GDM).  Improve blood sugar (glucose) control for women who have GDM.  Decrease the risk of developing preeclampsia. This is a serious condition that causes high blood pressure along with other symptoms, such as swelling and headaches.  Decrease the risk of cesarean delivery.  Speed up the recovery after giving birth.  How often should I exercise? Unless your health care provider gives you different instructions, you should try to exercise on most days or all days of the week. In general, try to exercise with moderate intensity for about 150 minutes per week. This can be spread out across several days, such as exercising for 30 minutes per day on 5 days of each week. You can tell that you are exercising at a moderate intensity if you have a higher heart rate and faster breathing, but you are still  able to hold a conversation. What types of moderate-intensity exercise are recommended during pregnancy? There are many types of exercise that are safe for you to do during pregnancy. Unless your health care provider gives you different instructions, do a variety of exercises that safely increase your heart and breathing (cardiopulmonary) rates and help you to build and maintain muscle strength (strength training). You should always be able to talk in full sentences while exercising during pregnancy. Some examples of exercising that is safe to do during pregnancy include:  Brisk walking or hiking.  Swimming.  Water aerobics.  Riding a stationary bike.  Strength training.  Modified yoga or Pilates. Tell your instructor that you are pregnant. Avoid overstretching and avoid lying on your back for long periods of time.  Running or jogging. Only choose this type of exercise if: ? You ran or jogged regularly before your pregnancy. ? You can run or jog and still talk in complete sentences.  What types of exercise should I not do during pregnancy? Depending on your level of fitness and whether you exercised regularly before your pregnancy, you may be   advised to limit vigorous-intensity exercise during your pregnancy. You can tell that you are exercising at a vigorous intensity if you are breathing much harder and faster and cannot hold a conversation while exercising. Some examples of exercising that you should avoid during pregnancy include:  Contact sports.  Activities that place you at risk for falling on or being hit in the belly, such as downhill skiing, water skiing, surfing, rock climbing, cycling, gymnastics, and horseback riding.  Scuba diving.  Sky diving.  Yoga or Pilates in a room that is heated to extreme temperatures ("hot yoga" or "hot Pilates").  Jogging or running, unless you ran or jogged regularly before your pregnancy. While jogging or running, you should always be able  to talk in full sentences. Do not run or jog so vigorously that you are unable to have a conversation.  If you are not used to exercising at elevation (more than 6,000 feet above sea level), do not do so during your pregnancy.  When should I avoid exercising during pregnancy? Certain medical conditions can make it unsafe to exercise during pregnancy, or they may increase your risk of miscarriage or early labor and birth. Some of these conditions include:  Some types of heart disease.  Some types of lung disease.  Placenta previa. This is when the placenta partially or completely covers the opening of the uterus (cervix).  Frequent bleeding from the vagina during your pregnancy.  Incompetent cervix. This is when your cervix does not remain as tightly closed during pregnancy as it should.  Premature labor.  Ruptured membranes. This is when the protective sac (amniotic sac) opens up and amniotic fluid leaks from your vagina.  Severely low blood count (anemia).  Preeclampsia or pregnancy-caused high blood pressure.  Carrying more than one baby (multiple gestation) and having an additional risk of early labor.  Poorly controlled diabetes.  Being severely underweight or severely overweight.  Intrauterine growth restriction. This is when your baby's growth and development during pregnancy are slower than expected.  Other medical conditions. Ask your health care provider if any apply to you.  What else should I know about exercising during pregnancy? You should take these precautions while exercising during pregnancy:  Avoid overheating. ? Wear loose-fitting, breathable clothes. ? Do not exercise in very high temperatures.  Avoid dehydration. Drink enough water before, during, and after exercise to keep your urine clear or pale yellow.  Avoid overstretching. Because of hormone changes during pregnancy, it is easy to overstretch muscles, tendons, and ligaments during  pregnancy.  Start slowly and ask your health care provider to recommend types of exercise that are safe for you, if exercising regularly is new for you.  Pregnancy is not a time for exercising to lose weight. When should I seek medical care? You should stop exercising and call your health care provider if you have any unusual symptoms, such as:  Mild uterine contractions or abdominal cramping.  Dizziness that does not improve with rest.  When should I seek immediate medical care? You should stop exercising and call your local emergency services (911 in the [place] if you have any unusual symptoms, such as:  Sudden, severe pain in your low back or your belly.  Uterine contractions or abdominal cramping that do not improve with rest.  Chest pain.  Bleeding or fluid leaking from your vagina.  Shortness of breath.  This information is not intended to replace advice given to you by your health care provider. Make sure you discuss any   questions you have with your health care provider. Document Released: 07/31/2005 Document Revised: 12/29/2015 Document Reviewed: 10/08/2014 [employer] Interactive Patient Education  2018 [employer] Eating Plan for Pregnant Women While you are pregnant, your body will require additional nutrition to help support your growing baby. It is recommended that you consume:  150 additional calories each day during your first trimester.  300 additional calories each day during your second trimester.  300 additional calories each day during your third trimester.  Eating a healthy, well-balanced diet is very important for your health and for your baby's health. You also have a higher need for some vitamins and minerals, such as folic acid, calcium, iron, and vitamin D. What do I need to know about eating during pregnancy?  Do not try to lose weight or go on a diet during pregnancy.  Choose healthy, nutritious foods. Choose  of a sandwich with a glass of milk  instead of a candy bar or a high-calorie sugar-sweetened beverage.  Limit your overall intake of foods that have "empty calories." These are foods that have little nutritional value, such as sweets, desserts, candies, sugar-sweetened beverages, and fried foods.  Eat a variety of foods, especially fruits and vegetables.  Take a prenatal vitamin to help meet the additional needs during pregnancy, specifically for folic acid, iron, calcium, and vitamin D.  Remember to stay active. Ask your health care provider for exercise recommendations that are specific to you.  Practice good food safety and cleanliness, such as washing your hands before you eat and after you prepare raw meat. This helps to prevent foodborne illnesses, such as listeriosis, that can be very dangerous for your baby. Ask your health care provider for more information about listeriosis. What does 150 extra calories look like? Healthy options for an additional 150 calories each day could be any of the following:  Plain low-fat yogurt (6-8 oz) with  cup of berries.  1 apple with 2 teaspoons of peanut butter.  Cut-up vegetables with  cup of hummus.  Low-fat chocolate milk (8 oz or 1 cup).  1 string cheese with 1 medium orange.   of a peanut butter and jelly sandwich on whole-wheat bread (1 tsp of peanut butter).  For 300 calories, you could eat two of those healthy options each day. What is a healthy amount of weight to gain? The recommended amount of weight for you to gain is based on your pre-pregnancy BMI. If your pre-pregnancy BMI was:  Less than 18 (underweight), you should gain 28-40 lb.  18-24.9 (normal), you should gain 25-35 lb.  25-29.9 (overweight), you should gain 15-25 lb.  Greater than 30 (obese), you should gain 11-20 lb.  What if I am having twins or multiples? Generally, pregnant women who will be having twins or multiples may need to increase their daily calories by 300-600 calories each day. The  recommended range for total weight gain is 25-54 lb, depending on your pre-pregnancy BMI. Talk with your health care provider for specific guidance about additional nutritional needs, weight gain, and exercise during your pregnancy. What foods can I eat? Grains Any grains. Try to choose whole grains, such as whole-wheat bread, oatmeal, or brown rice. Vegetables Any vegetables. Try to eat a variety of colors and types of vegetables to get a full range of vitamins and minerals. Remember to wash your vegetables well before eating. Fruits Any fruits. Try to eat a variety of colors and types of fruit to get a full range of vitamins and   minerals. Remember to wash your fruits well before eating. Meats and Other Protein Sources Lean meats, including chicken, turkey, fish, and lean cuts of beef, veal, or pork. Make sure that all meats are cooked to "well done." Tofu. Tempeh. Beans. Eggs. Peanut butter and other nut butters. Seafood, such as shrimp, crab, and lobster. If you choose fish, select types that are higher in omega-3 fatty acids, including salmon, herring, mussels, trout, sardines, and pollock. Make sure that all meats are cooked to food-safe temperatures. Dairy Pasteurized milk and milk alternatives. Pasteurized yogurt and pasteurized cheese. Cottage cheese. Sour cream. Beverages Water. Juices that contain 100% fruit juice or vegetable juice. Caffeine-free teas and decaffeinated coffee. Drinks that contain caffeine are okay to drink, but it is better to avoid caffeine. Keep your total caffeine intake to less than 200 mg each day (12 oz of coffee, tea, or soda) or as directed by your health care provider. Condiments Any pasteurized condiments. Sweets and Desserts Any sweets and desserts. Fats and Oils Any fats and oils. The items listed above may not be a complete list of recommended foods or beverages. Contact your dietitian for more options. What foods are not  recommended? Vegetables Unpasteurized (raw) vegetable juices. Fruits Unpasteurized (raw) fruit juices. Meats and Other Protein Sources Cured meats that have nitrates, such as bacon, salami, and hotdogs. Luncheon meats, bologna, or other deli meats (unless they are reheated until they are steaming hot). Refrigerated pate, meat spreads from a meat counter, smoked seafood that is found in the refrigerated section of a store. Raw fish, such as sushi or sashimi. High mercury content fish, such as tilefish, shark, swordfish, and king mackerel. Raw meats, such as tuna or beef tartare. Undercooked meats and poultry. Make sure that all meats are cooked to food-safe temperatures. Dairy Unpasteurized (raw) milk and any foods that have raw milk in them. Soft cheeses, such as feta, queso blanco, queso fresco, Brie, Camembert cheeses, blue-veined cheeses, and Panela cheese (unless it is made with pasteurized milk, which must be stated on the label). Beverages Alcohol. Sugar-sweetened beverages, such as sodas, teas, or energy drinks. Condiments Homemade fermented foods and drinks, such as pickles, sauerkraut, or kombucha drinks. (Store-bought pasteurized versions of these are okay.) Other Salads that are made in the store, such as ham salad, chicken salad, egg salad, tuna salad, and seafood salad. The items listed above may not be a complete list of foods and beverages to avoid. Contact your dietitian for more information. This information is not intended to replace advice given to you by your health care provider. Make sure you discuss any questions you have with your health care provider. Document Released: 05/15/2014 Document Revised: 01/06/2016 Document Reviewed: 01/13/2014 [employer] Interactive Patient Education  2018 [employer]  

## 2017-10-05 NOTE — Progress Notes (Signed)
NOB today.  

## 2017-10-05 NOTE — Progress Notes (Signed)
New Obstetric Patient H&P    Chief Complaint: "Desires prenatal care"   History of Present Illness: Patient is a 27 y.o. [id number] Not Hispanic or Latino female, LMP approximately 08/14/2017 presents with amenorrhea and positive home pregnancy test. Based on her  LMP, her EDD is Estimated Date of Delivery: 05/16/2018. and her EGA is [redacted]w[redacted]d. Cycles are 4. days, regular, and occur approximately every : 28 days. Her last pap smear was 1 years ago and was no abnormalities.    She had a urine pregnancy test which was positive 3 week(s)  ago. Her last menstrual period was normal and lasted for  4 or 5 day(s). Since her LMP she claims she has experienced breast tenderness, fatigue, nausea. She denies vaginal bleeding. Her past medical history is noncontributory. Her prior pregnancies are notable for 1 SAB and 1 TAB  Since her LMP, she admits to the use of tobacco products  no She claims she has lost  5 pounds since the start of her pregnancy.  There are cats in the home in the home  no  She admits close contact with children on a regular basis  no  She has had chicken pox in the past yes She has had Tuberculosis exposures, symptoms, or previously tested positive for TB   no Current or past history of domestic violence. no  Genetic Screening/Teratology Counseling: (Includes patient, baby's father, or anyone in either family with:)   1. Patient's age >/= 58 at [facility]  no 2. Thalassemia ([place], [place], Mediterranean, or Asian background): MCV<80  no 3. Neural tube defect (meningomyelocele, spina bifida, anencephaly)  no 4. Congenital heart defect  no  5. Down syndrome  no 6. Tay-Sachs (Jewish, [place]  no 7. Canavan's Disease  no 8. Sickle cell disease or trait (African)  no  9. Hemophilia or other blood disorders  no  10. Muscular dystrophy  no  11. Cystic fibrosis  no  12. Huntington's Chorea  no  13. Mental retardation/autism  no 14. Other inherited genetic or chromosomal disorder   no 15. Maternal metabolic disorder (DM, PKU, etc)  no 16. Patient or FOB with a child with a birth defect not listed above no  16a. Patient or FOB with a birth defect themselves no 17. Recurrent pregnancy loss, or stillbirth  no  18. Any medications since LMP other than prenatal vitamins (include vitamins, supplements, OTC meds, drugs, alcohol)  no 19. Any other genetic/environmental exposure to discuss  no  Infection History:   1. Lives with someone with TB or TB exposed  no  2. Patient or partner has history of genital herpes  no 3. Rash or viral illness since LMP  no 4. History of STI (GC, CT, HPV, syphilis, HIV)  no 5. History of recent travel :  no  Other pertinent information:  no     Review of Systems:10 point review of systems negative unless otherwise noted in HPI  Past Medical History:  Past Medical History:  Diagnosis Date  . Abortion   . Miscarriage     Past Surgical History:  Past Surgical History:  Procedure Laterality Date  . HAND SURGERY Right     Gynecologic History: Patient's last menstrual period was 08/14/2017.  Obstetric History: G3P0020  Family History:  Family History  Problem Relation Age of Onset  . Congestive Heart Failure Father   . Lupus Maternal Grandmother     Social History:  Social History   Socioeconomic History  . Marital status: Single  Spouse name: Not on file  . Number of children: Not on file  . Years of education: Not on file  . Highest education level: Not on file  Social Needs  . Financial resource strain: Not on file  . Food insecurity - worry: Not on file  . Food insecurity - inability: Not on file  . Transportation needs - medical: Not on file  . Transportation needs - non-medical: Not on file  Occupational History  . Not on file  Tobacco Use  . Smoking status: Former Smoker    Packs/day: 1.00    Years: 3.00    Pack years: 3.00    Types: Cigarettes  . Smokeless tobacco: Never Used  Substance and Sexual  Activity  . Alcohol use: Yes    Comment: occasionally  . Drug use: No  . Sexual activity: Yes    Birth control/protection: None  Other Topics Concern  . Not on file  Social History Narrative  . Not on file    Allergies:  No Known Allergies  Medications: Prior to Admission medications   Medication Sig Start Date End Date Taking? Authorizing Provider  Prenatal Vit-Fe Fumarate-FA (MULTIVITAMIN-PRENATAL) 27-0.8 MG TABS tablet Take 1 tablet by mouth daily at 12 noon.   Yes [provider]    Physical Exam Vitals: Blood pressure 124/70, weight 173 lb (78.5 kg), last menstrual period 08/14/2017.  General: NAD HEENT: normocephalic, anicteric Thyroid: no enlargement, no palpable nodules Pulmonary: No increased work of breathing, CTAB Cardiovascular: RRR, distal pulses 2+ Abdomen: NABS, soft, non-tender, non-distended.  Umbilicus without lesions.  No hepatomegaly, splenomegaly or masses palpable. No evidence of hernia  Genitourinary: deferred for PAP screening interval, labs done with urine   Extremities: no edema, erythema, or tenderness Neurologic: Grossly intact Psychiatric: mood appropriate, affect full   Assessment: 27 y.o. G3P0020 at 6059w3d by LMP presenting to initiate prenatal care  Plan: 1) Avoid alcoholic beverages. 2) Patient encouraged not to smoke.  3) Discontinue the use of all non-medicinal drugs and chemicals.  4) Take prenatal vitamins daily.  5) Nutrition, food safety (fish, cheese advisories, and high nitrite foods) and exercise discussed. 6) Hospital and practice style discussed with cross coverage system.  7) Genetic Screening, such as with 1st Trimester Screening, cell free fetal DNA, AFP testing, and Ultrasound, as well as with amniocentesis and CVS as appropriate, is discussed with patient. At the conclusion of today's visit patient requested genetic testing 8) Patient is asked about travel to areas at risk for the Zika virus, and counseled to avoid  travel and exposure to mosquitoes or sexual partners who may have themselves been exposed to the virus. Testing is discussed, and will be ordered as appropriate.  9) Dating scan, early 1 hour gtt, ROB in 1 week   Tresea MallJane Mataya Kilduff, CNM [facility], [facility] 10/05/2017, 4:56 PM

## 2017-10-07 LAB — CHLAMYDIA/GONOCOCCUS/TRICHOMONAS, NAA
CHLAMYDIA BY NAA: NEGATIVE
Gonococcus by NAA: NEGATIVE
Trich vag by NAA: NEGATIVE

## 2017-10-07 LAB — URINE CULTURE: Organism ID, Bacteria: NO GROWTH

## 2017-10-12 ENCOUNTER — Ambulatory Visit (INDEPENDENT_AMBULATORY_CARE_PROVIDER_SITE_OTHER): Payer: 59 | Admitting: Advanced Practice Midwife

## 2017-10-12 ENCOUNTER — Ambulatory Visit (INDEPENDENT_AMBULATORY_CARE_PROVIDER_SITE_OTHER): Payer: 59

## 2017-10-12 ENCOUNTER — Encounter: Payer: Self-pay | Admitting: Advanced Practice Midwife

## 2017-10-12 ENCOUNTER — Ambulatory Visit: Payer: 59

## 2017-10-12 VITALS — BP 122/80 | Wt 177.0 lb

## 2017-10-12 DIAGNOSIS — O0990 Supervision of high risk pregnancy, unspecified, unspecified trimester: Secondary | ICD-10-CM

## 2017-10-12 DIAGNOSIS — Z1379 Encounter for other screening for genetic and chromosomal anomalies: Secondary | ICD-10-CM

## 2017-10-12 DIAGNOSIS — Z6832 Body mass index (BMI) 32.0-32.9, adult: Secondary | ICD-10-CM

## 2017-10-12 DIAGNOSIS — Z131 Encounter for screening for diabetes mellitus: Secondary | ICD-10-CM

## 2017-10-12 DIAGNOSIS — Z113 Encounter for screening for infections with a predominantly sexual mode of transmission: Secondary | ICD-10-CM | POA: Diagnosis not present

## 2017-10-12 DIAGNOSIS — O99210 Obesity complicating pregnancy, unspecified trimester: Secondary | ICD-10-CM | POA: Diagnosis not present

## 2017-10-12 DIAGNOSIS — Z3A08 8 weeks gestation of pregnancy: Secondary | ICD-10-CM

## 2017-10-12 NOTE — Progress Notes (Signed)
  Routine Prenatal Care Visit  Subjective  Summer Powers is a 27 y.o. G3P0020 at 8 weeks 3 days by LMP being seen today for ongoing prenatal care.  She is currently monitored for the following issues for this high-risk pregnancy and has Supervision of high risk pregnancy, antepartum and Obesity affecting pregnancy, antepartum on their problem list.  ----------------------------------------------------------------------------------- Patient reports no complaints.   Contractions: Not present. Vag. Bleeding: None.   . Denies leaking of fluid.  ----------------------------------------------------------------------------------- The following portions of the patient's history were reviewed and updated as appropriate: allergies, current medications, past family history, past medical history, past social history, past surgical history and problem list. Problem list updated.   Objective  Blood pressure 122/80, weight 177 lb (80.3 kg), last menstrual period 08/14/2017. Pregravid weight 178 lb (80.7 kg) Total Weight Gain  (-0.454 kg) Urinalysis:      Fetal Status: Fetal Heart Rate (bpm): 155         Dating scan today agrees with LMP  General:  Alert, oriented and cooperative. Patient is in no acute distress.  Skin: Skin is warm and dry. No rash noted.   Cardiovascular: Normal heart rate noted  Respiratory: Normal respiratory effort, no problems with respiration noted  Abdomen: Soft, gravid, appropriate for gestational age. Pain/Pressure: Absent     Pelvic:  Cervical exam deferred        Extremities: Normal range of motion.     Mental Status: Normal mood and affect. Normal behavior. Normal judgment and thought content.   Assessment   26 y.o. G3P0020 at 8 weeks 3 days by LMP. presenting for routine prenatal visit  Plan   pregnancy Problems (from 10/05/17 to present)    No problems associated with this episode.       Preterm labor symptoms and general obstetric precautions including but  not limited to vaginal bleeding, contractions, leaking of fluid and fetal movement were reviewed in detail with the patient.   Return in about 4 weeks (around 11/09/2017) for NT scan/1st tri screen/rob.  Tresea MallJane Aaban Griep, CNM 10/12/2017 2:58 PM

## 2017-10-16 LAB — GLUCOSE, 1 HOUR GESTATIONAL: GESTATIONAL DIABETES SCREEN: 76 mg/dL (ref 65–139)

## 2017-10-16 LAB — RPR+RH+ABO+RUB AB+AB SCR+CB...
Antibody Screen: NEGATIVE
HEMOGLOBIN: 13 g/dL (ref 11.1–15.9)
HEP B S AG: NEGATIVE
HIV SCREEN 4TH GENERATION: NONREACTIVE
Hematocrit: 39.8 % (ref 34.0–46.6)
MCH: 28 pg (ref 26.6–33.0)
MCHC: 32.7 g/dL (ref 31.5–35.7)
MCV: 86 fL (ref 79–97)
Platelets: 202 10*3/uL (ref 150–379)
RBC: 4.65 x10E6/uL (ref 3.77–5.28)
RDW: 13.1 % (ref 12.3–15.4)
RPR: NONREACTIVE
Rh Factor: POSITIVE
Rubella Antibodies, IGG: 2.33 index (ref >0.99)
VARICELLA: 3843 {index} (ref >165)
WBC: 9.8 10*3/uL (ref 3.4–10.8)

## 2017-10-16 LAB — HEMOGLOBINOPATHY EVALUATION
HGB C: 0 %
HGB S: 0 %
HGB VARIANT: 0 %
Hemoglobin A2 Quantitation: 2.1 % (ref 1.8–3.2)
Hemoglobin F Quantitation: 0 % (ref 0.0–2.0)
Hgb A: 97.9 % (ref 96.4–98.8)

## 2017-10-19 ENCOUNTER — Ambulatory Visit (INDEPENDENT_AMBULATORY_CARE_PROVIDER_SITE_OTHER): Payer: Self-pay | Admitting: Family Medicine

## 2017-10-19 ENCOUNTER — Encounter: Payer: Self-pay | Admitting: Family Medicine

## 2017-10-19 VITALS — BP 121/75 | HR 91 | Temp 98.4°F | Wt 172.0 lb

## 2017-10-19 DIAGNOSIS — J310 Chronic rhinitis: Secondary | ICD-10-CM

## 2017-10-19 DIAGNOSIS — J3489 Other specified disorders of nose and nasal sinuses: Secondary | ICD-10-CM

## 2017-10-19 DIAGNOSIS — R238 Other skin changes: Secondary | ICD-10-CM

## 2017-10-19 DIAGNOSIS — I499 Cardiac arrhythmia, unspecified: Secondary | ICD-10-CM

## 2017-10-19 MED ORDER — BACITRACIN 500 UNIT/GM EX OINT: 1.0000 "application " | TOPICAL_OINTMENT | Freq: Two times a day (BID) | CUTANEOUS | 0 refills | Status: DC

## 2017-10-19 MED ORDER — SALINE SPRAY 0.65 % NA SOLN: 1.0000 | NASAL | 0 refills | Status: DC | PRN

## 2017-10-19 MED ORDER — CETIRIZINE HCL 10 MG PO TABS: 10.0000 mg | ORAL_TABLET | Freq: Every day | ORAL | 0 refills | Status: DC

## 2017-10-19 NOTE — Progress Notes (Signed)
Summer Powers is a 27 y.o. female who is present for concerns about nasal dryness for about 30 days and facial redness and tenderness around her nose more prevalent on the right side for about 1-2 days and she reports it is becoming more painful and she feels pressure and has experienced recent headaches. She also reports that she is approximately [redacted] weeks pregnant with her first child. She has attended her first appointment with her assigned OB provider and is currently only taking prenatal medications and tylenol PRN. She denies any chronic medical conditions and does not report receiving routine care from a PCP.   Review of Systems  Constitutional: Positive for fever and malaise/fatigue.  HENT: Positive for sinus pain. Negative for ear discharge, ear pain, hearing loss, nosebleeds, sore throat and tinnitus.   Eyes: Negative.  Negative for blurred vision, double vision, photophobia, pain, discharge and redness.  Cardiovascular: Negative for chest pain and palpitations.  Gastrointestinal: Positive for nausea. Negative for heartburn.  Musculoskeletal: Negative for back pain, myalgias and neck pain.  Neurological: Positive for headaches.  Psychiatric/Behavioral: Negative.     O: Vitals:   10/19/17 1116  BP: 121/75  Pulse: 91  Temp: 98.4 F (36.9 C)  SpO2: 99%   Physical Exam  Constitutional: She is oriented to person, place, and time. Vital signs are normal. She appears well-developed and well-nourished.  HENT:  Head: Normocephalic.    Right Ear: Hearing, tympanic membrane, external ear and ear canal normal.  Left Ear: Hearing, tympanic membrane, external ear and ear canal normal.  Nose: Nose normal.  Mouth/Throat: Uvula is midline and oropharynx is clear and moist.  Very Mild erythema, warmth and puffiness non flutulant limited to area near the nose on th right side- no evidence of effusion, or deformity, skin was intact with no lesions present. No evidence of herpetic lesions, acne  or abrasions or scabbing visualized. Use of light and illumination of sinuses performed no evidence of opacity or abceses observed. No concern or evidence of periorbital nor septal cellulitis/edema or swelling.  Neck: Normal range of motion.  Cardiovascular: Normal rate, normal heart sounds and normal pulses. An irregular rhythm present.  EKG completed see scanned attachment - WNL on review- patient otherwise asymptomatic- irregular rhythm on auscultation.  Pulmonary/Chest: Effort normal and breath sounds normal.  Abdominal: Soft.  Musculoskeletal: Normal range of motion.  Neurological: She is alert and oriented to person, place, and time.  Skin: Skin is warm and dry. There is erythema.     Psychiatric: She has a normal mood and affect.   A: 1. Rhinitis, unspecified type   2. Nasal dryness   3. Skin irritation    P: 1. Rhinitis, unspecified type - cetirizine (ZYRTEC) 10 MG tablet; Take 1 tablet (10 mg total) by mouth daily.  2. Nasal dryness - sodium chloride (OCEAN) 0.65 % SOLN nasal spray; Place 1 spray into both nostrils as needed for congestion.  3. Skin irritation - bacitracin 500 UNIT/GM ointment; Apply 1 application topically 2 (two) times daily.  4. Irregular heartbeat EKG completed- reviewed WNL today Patient asymptomatic but has family history "father" with CHF and tachycardia.  Advised patient obtain PCP for futher eval and treatment and go to ER for emergent concerns. Patient provided a list of local providers who are accepting new patients.Patient left clinic ambulatory in NAD and denies known palpatations, or concerns related to heart beat.  5. Pregnancy- Reviewed with patient why antibiotics were not prescribed today and the role medications have the  ability to play during pregnancy in relation to fetal development. Pregnancy categories for all medication reviewed with patient who verbalized understanding of information provided.   Discussed the risks and benefits of  amoxicillin for potential differential dx of sinusitis versus cellulitis and the typical treatment and risks related to this. ie. Amoxicillin preg Cat B- but has risk of cleft lip/cleft palate associations- pt is in 9 weeks where lip and sinus development occurs and joint decision of patient and provider was watchful waiting over the next couple days and weeks and f/u if symptoms persist or do not improve.

## 2017-10-19 NOTE — Patient Instructions (Addendum)
Nonallergic Rhinitis Nonallergic rhinitis is a condition that causes symptoms that affect the nose, such as a runny nose and a stuffed-up nose (nasal congestion) that can make it hard to breathe through the nose. This condition is different from having an allergy (allergic rhinitis). Allergic rhinitis occurs when the body's defense system (immune system) reacts to a substance that you are allergic to (allergen), such as pollen, pet dander, mold, or dust. Nonallergic rhinitis has many similar symptoms, but it is not caused by allergens. Nonallergic rhinitis can be a short-term or long-term problem. What are the causes? This condition can be caused by many different things. Some common types of nonallergic rhinitis include: Infectious rhinitis  This is usually due to an infection in the upper respiratory tract. Vasomotor rhinitis  This is the most common type of long-term nonallergic rhinitis.  It is caused by too much blood flow through the nose, which makes the tissue inside of the nose swell.  Symptoms are often triggered by strong odors, cold air, stress, drinking alcohol, cigarette smoke, or changes in the weather. Occupational rhinitis  This type is caused by triggers in the workplace, such as chemicals, dusts, animal dander, or air pollution. Hormonal rhinitis  This type occurs in women as a result of an increase in the female hormone estrogen.  It may occur during pregnancy, puberty, and menstrual cycles.  Symptoms improve when estrogen levels drop. Drug-induced rhinitis Several drugs can cause nonallergic rhinitis, including:  Medicines that are used to treat high blood pressure, heart disease, and Parkinson disease.  Aspirin and NSAIDs.  Over-the-counter nasal decongestant sprays. These can cause a type of nonallergic rhinitis (rhinitis medicamentosa) when they are used for more than a few days.  Nonallergic rhinitis with eosinophilia syndrome (NARES)  This type is caused  by having too much of a certain type of white blood cell (eosinophil). Nonallergic rhinitis can also be caused by a reaction to eating hot or spicy foods. This does not usually cause long-term symptoms. In some cases, the cause of nonallergic rhinitis is not known. What increases the risk? You are more likely to develop this condition if:  You are 30-60 years of age.  You are a woman. Women are twice as likely to have this condition.  What are the signs or symptoms? Common symptoms of this condition include:  Nasal congestion.  Runny nose.  The feeling of mucus going down the back of the throat (postnasal drip).  Trouble sleeping at night and daytime sleepiness.  Less common symptoms include:  Sneezing.  Coughing.  Itchy nose.  Bloodshot eyes.  How is this diagnosed? This condition may be diagnosed based on:  Your symptoms and medical history.  A physical exam.  Allergy testing to rule out allergic rhinitis. You may have skin tests or blood tests.  In some cases, the health care provider may take a swab of nasal secretions to look for an increased number of eosinophils. This would be done to confirm a diagnosis of NARES. How is this treated? Treatment for this condition depends on the cause. No single treatment works for everyone. Work with your health care provider to find the best treatment for you. Treatment may include:  Avoiding the things that trigger your symptoms.  Using medicines to relieve congestion, such as: ? Steroid nasal spray. There are many types. You may need to try a few to find out which one works best. ? Decongestant medicine. This may be an oral medicine or a nasal spray. These   medicines are only used for a short time.  Using medicines to relieve a runny nose. These may include antihistamine medicines or anticholinergic nasal sprays.  Surgery to remove tissue from inside the nose may be needed in severe cases if the condition has not improved  after 6-12 months of medical treatment. Follow these instructions at home:  Take or use over-the-counter and prescription medicines only as told by your health care provider. Do not stop using your medicine even if you start to feel better.  Use salt-water (saline) rinses or other solutions (nasal washes or irrigations) to wash or rinse out the inside of your nose as told by your health care provider.  Do not take NSAIDs or medicines that contain aspirin if they make your symptoms worse.  Do not drink alcohol if it makes your symptoms worse.  Do not use any tobacco products, such as cigarettes, chewing tobacco, and e-cigarettes. If you need help quitting, ask your health care provider.  Avoid secondhand smoke.  Get some exercise every day. Exercise may help reduce symptoms of nonallergic rhinitis for some people. Ask your health care provider how much exercise and what types of exercise are safe for you.  Sleep with the head of your bed raised (elevated). This may reduce nighttime nasal congestion.  Keep all follow-up visits as told by your health care provider. This is important. Contact a health care provider if:  You have a fever.  Your symptoms are getting worse at home.  Your symptoms are not responding to medicine.  You develop new symptoms, especially a headache or nosebleed. This information is not intended to replace advice given to you by your health care provider. Make sure you discuss any questions you have with your health care provider. Document Released: 11/22/2015 Document Revised: 01/06/2016 Document Reviewed: 10/21/2015 Elsevier Interactive Patient Education  2018 Elsevier Inc.  

## 2017-11-09 ENCOUNTER — Other Ambulatory Visit: Payer: 59

## 2017-11-09 ENCOUNTER — Encounter: Payer: 59 | Admitting: Advanced Practice Midwife

## 2017-12-27 DIAGNOSIS — J019 Acute sinusitis, unspecified: Secondary | ICD-10-CM | POA: Diagnosis not present

## 2017-12-27 DIAGNOSIS — J029 Acute pharyngitis, unspecified: Secondary | ICD-10-CM | POA: Diagnosis not present

## 2018-05-01 DIAGNOSIS — Z113 Encounter for screening for infections with a predominantly sexual mode of transmission: Secondary | ICD-10-CM | POA: Diagnosis not present

## 2018-05-01 DIAGNOSIS — Z202 Contact with and (suspected) exposure to infections with a predominantly sexual mode of transmission: Secondary | ICD-10-CM | POA: Diagnosis not present

## 2018-06-03 DIAGNOSIS — L732 Hidradenitis suppurativa: Secondary | ICD-10-CM | POA: Diagnosis not present

## 2018-08-13 ENCOUNTER — Encounter (HOSPITAL_COMMUNITY): Payer: Self-pay

## 2018-09-13 DIAGNOSIS — L732 Hidradenitis suppurativa: Secondary | ICD-10-CM | POA: Insufficient documentation

## 2018-09-13 LAB — HM HIV SCREENING LAB: HM HIV Screening: NEGATIVE

## 2018-09-13 LAB — HM HEPATITIS C SCREENING LAB: HM Hepatitis Screen: NEGATIVE

## 2019-02-26 DIAGNOSIS — H5213 Myopia, bilateral: Secondary | ICD-10-CM | POA: Diagnosis not present

## 2019-04-02 DIAGNOSIS — L732 Hidradenitis suppurativa: Secondary | ICD-10-CM

## 2019-04-03 ENCOUNTER — Ambulatory Visit: Payer: Self-pay

## 2019-04-17 ENCOUNTER — Ambulatory Visit: Payer: Self-pay

## 2019-04-23 ENCOUNTER — Ambulatory Visit: Payer: Self-pay | Admitting: Physician Assistant

## 2019-04-23 ENCOUNTER — Other Ambulatory Visit: Payer: Self-pay

## 2019-04-23 ENCOUNTER — Encounter: Payer: Self-pay | Admitting: Physician Assistant

## 2019-04-23 DIAGNOSIS — Z113 Encounter for screening for infections with a predominantly sexual mode of transmission: Secondary | ICD-10-CM

## 2019-04-23 DIAGNOSIS — N760 Acute vaginitis: Secondary | ICD-10-CM

## 2019-04-23 DIAGNOSIS — B9689 Other specified bacterial agents as the cause of diseases classified elsewhere: Secondary | ICD-10-CM

## 2019-04-23 LAB — WET PREP FOR TRICH, YEAST, CLUE
Clue Cell Exam: POSITIVE — AB
Trichomonas Exam: NEGATIVE
Yeast Exam: NEGATIVE

## 2019-04-23 MED ORDER — METRONIDAZOLE 0.75 % VA GEL: 1.0000 | Freq: Every day | VAGINAL | 0 refills | Status: DC

## 2019-04-23 NOTE — Progress Notes (Signed)
    STI clinic/screening visit  Subjective:  Summer Powers is a 28 y.o. female being seen today for an STI screening visit. The patient reports they do have symptoms.  Patient has the following medical conditions:   Patient Active Problem List   Diagnosis Date Noted  . Hidradenitis suppurativa 09/13/2018     Chief Complaint  Patient presents with  . SEXUALLY TRANSMITTED DISEASE    HPI  Patient reports that she has had a vaginal odor for about 3 weeks.  Denies any other symptoms.  LMP 04/13/2019 and normal.  See flowsheet for further details and programmatic requirements.    The following portions of the patient's history were reviewed and updated as appropriate: allergies, current medications, past medical history, past social history, past surgical history and problem list.  Objective:  There were no vitals filed for this visit.  Physical Exam Constitutional:      General: She is not in acute distress.    Appearance: Normal appearance.  HENT:     Head: Normocephalic and atraumatic.     Mouth/Throat:     Mouth: Mucous membranes are moist.     Pharynx: Oropharynx is clear. No oropharyngeal exudate or posterior oropharyngeal erythema.  Eyes:     Conjunctiva/sclera: Conjunctivae normal.  Neck:     Musculoskeletal: Neck supple.  Pulmonary:     Effort: Pulmonary effort is normal.  Abdominal:     Palpations: There is no mass.     Tenderness: There is no abdominal tenderness. There is no guarding or rebound.  Genitourinary:    General: Normal vulva.     Rectum: Normal.     Comments: External genitalia/pubic area without nits, lice, edema, erythema, lesions and inguinal adenopathy. Vagina with normal mucosa, small amount of thin, white discharge, pH =>4.5. Cervix without visible lesions. Uterus normal size, firm, mobile, nt, no CMT, no masses, no adnexal tenderness or fullness.  Lymphadenopathy:     Cervical: No cervical adenopathy.  Skin:    General: Skin is warm and  dry.     Findings: No bruising, erythema, lesion or rash.  Neurological:     Mental Status: She is alert and oriented to person, place, and time.  Psychiatric:        Mood and Affect: Mood normal.        Behavior: Behavior normal.        Thought Content: Thought content normal.        Judgment: Judgment normal.       Assessment and Plan:  Summer Powers is a 28 y.o. female [facility] [address] [facility] for [facility]I screening  1. Screening for STD (sexually transmitted disease) Patient is having symptoms. Rec condoms with all sex. Await test results.  Counseled that RN will call if needs to RTC for any treatment once results are back. - WET PREP FOR [place], YEAST, CLUE - Chlamydia/Gonorrhea [facility] - HIV [facility] [facility] - Syphilis Serology, [facility]  2. BV (bacterial vaginosis) Patient declines po Metronidazole.  Will give Metro Gel/Vandazole gel 1 app qhs for 5-7 days . No sex for 7 days Rec use OTC antifungal cream if needed for itching during and just after treatment with gel. - metroNIDAZOLE (METROGEL VAGINAL) 0.75 % vaginal gel; Place 1 Applicatorful vaginally at bedtime.  Dispense: 70 g; Refill: 0     No follow-ups on file.  No future appointments.  Jerene Dilling, PA

## 2019-10-21 ENCOUNTER — Encounter: Payer: 59 | Admitting: Obstetrics and Gynecology

## 2019-12-22 ENCOUNTER — Ambulatory Visit: Payer: Self-pay | Admitting: Family Medicine

## 2019-12-22 ENCOUNTER — Encounter: Payer: Self-pay | Admitting: Family Medicine

## 2019-12-22 ENCOUNTER — Other Ambulatory Visit: Payer: Self-pay

## 2019-12-22 DIAGNOSIS — Z113 Encounter for screening for infections with a predominantly sexual mode of transmission: Secondary | ICD-10-CM

## 2019-12-22 DIAGNOSIS — Z332 Encounter for elective termination of pregnancy: Secondary | ICD-10-CM

## 2019-12-22 LAB — PREGNANCY, URINE: Preg Test, Ur: NEGATIVE

## 2019-12-22 NOTE — Progress Notes (Signed)
[facility] Department STI clinic/screening visit  Subjective:  Summer Powers is a 29 y.o. female being seen today for an STI screening visit. The patient reports they do not have symptoms.  Patient reports that they do not desire a pregnancy in the next year.   They reported they are not interested in discussing contraception today.  Patient's last menstrual period was 12/20/2019 (exact date).   Patient has the following medical conditions:   Patient Active Problem List   Diagnosis Date Noted  . Hidradenitis suppurativa 09/13/2018    Chief Complaint  Patient presents with  . Exposure to STD    HPI  Patient reports TAB in March, no vaginal intercourse since. Reports a medical/pill based AB and did not follow up afterwards. She is currently on her menses and it is her first after the medical abortion she had on 10/21/19.    Reports having discharge prior to menses with foul odor, thinks it is BV  See flowsheet for further details and programmatic requirements.    The following portions of the patient's history were reviewed and updated as appropriate: allergies, current medications, past medical history, past social history, past surgical history and problem list.  Objective:  There were no vitals filed for this visit.  Physical Exam Vitals and nursing note reviewed.  Constitutional:      Appearance: Normal appearance.  HENT:     Head: Normocephalic and atraumatic.     Mouth/Throat:     Mouth: Mucous membranes are moist.     Pharynx: Oropharynx is clear. No oropharyngeal exudate or posterior oropharyngeal erythema.  Pulmonary:     Effort: Pulmonary effort is normal.  Abdominal:     General: Abdomen is flat.     Palpations: There is no mass.     Tenderness: There is no abdominal tenderness. There is no rebound.  Genitourinary:    General: Normal vulva.     Exam position: Lithotomy position.     Pubic Area: No rash or pubic lice.      Labia:        Right: No  rash or lesion.        Left: No rash or lesion.      Vagina: Bleeding (heavy vaginal bleeding, is on day 3 of cycle with large clots. Unable to colelct wet mount due to bleeding. ) present. No vaginal discharge, erythema or lesions.     Cervix: No cervical motion tenderness, discharge, friability, lesion or erythema.     Uterus: Normal.      Adnexa: Right adnexa normal and left adnexa normal.     Rectum: Normal.  Lymphadenopathy:     Head:     Right side of head: No preauricular or posterior auricular adenopathy.     Left side of head: No preauricular or posterior auricular adenopathy.     Cervical: No cervical adenopathy.     Upper Body:     Right upper body: No supraclavicular or axillary adenopathy.     Left upper body: No supraclavicular or axillary adenopathy.     Lower Body: No right inguinal adenopathy. No left inguinal adenopathy.  Skin:    General: Skin is warm and dry.     Findings: No rash.  Neurological:     Mental Status: She is alert and oriented to person, place, and time.      Assessment and Plan:  Summer Powers is a 29 y.o. female presenting to the [facility] Department for STI screening  1. Screening for STD (sexually transmitted disease) Patient accepted all screenings including oral, vaginal CT/GC and bloodwork for HIV/RPR.  Patient meets criteria for HepB screening? No. Ordered? No - NA Patient meets criteria for HepC screening? Yes. Ordered? Yes  Treat wet prep per standing order Discussed time line for State Lab results and that patient will be called with positive results and encouraged patient to call if she had not heard in 2 weeks.  Counseled to return or seek care for continued or worsening symptoms Recommended condom use with all sex  Patient is currently using Occasional condom use to prevent pregnancy.  - Wet Mount not collected due to her heavy bleeding. Discussed returning for repeat exam with swabs when not on menses.  -  Chlamydia/Gonorrhea [place] [facility] - Syphilis Serology, [place] [facility] - HIV/HCV [facility] - Pregnancy, urine  2. Medical abortion Recent medical abortion 3/9/2, did not have follow up but reports heavy bleeding with clots and cramping consistent with passing POCs. Will get urine pregnanancy test. If not no longer pregnant UPT should be negative now that she is 2 months from procedure. If positive she had not had sex and thus this would be an unresolved pregnancy - Pregnancy, urine      Return if symptoms worsen or fail to improve.  No future appointments.  Federico Flake, MD

## 2019-12-22 NOTE — Progress Notes (Signed)
PT negative..Waynette Towers Brewer-Jensen, RN  

## 2019-12-26 LAB — HM HIV SCREENING LAB: HM HIV Screening: NEGATIVE

## 2019-12-26 LAB — HM HEPATITIS C SCREENING LAB: HM Hepatitis Screen: NEGATIVE

## 2020-10-05 DIAGNOSIS — H5213 Myopia, bilateral: Secondary | ICD-10-CM | POA: Diagnosis not present

## 2020-10-18 ENCOUNTER — Emergency Department
Admission: EM | Admit: 2020-10-18 | Discharge: 2020-10-18 | Disposition: A | Discharge status: Home / Self Care | Payer: 59 | Attending: Emergency Medicine | Admitting: Emergency Medicine

## 2020-10-18 ENCOUNTER — Other Ambulatory Visit: Payer: Self-pay

## 2020-10-18 ENCOUNTER — Emergency Department: Payer: 59

## 2020-10-18 DIAGNOSIS — F17210 Nicotine dependence, cigarettes, uncomplicated: Secondary | ICD-10-CM | POA: Insufficient documentation

## 2020-10-18 DIAGNOSIS — Z3A00 Weeks of gestation of pregnancy not specified: Secondary | ICD-10-CM | POA: Diagnosis not present

## 2020-10-18 DIAGNOSIS — O200 Threatened abortion: Secondary | ICD-10-CM | POA: Insufficient documentation

## 2020-10-18 DIAGNOSIS — O4690 Antepartum hemorrhage, unspecified, unspecified trimester: Secondary | ICD-10-CM

## 2020-10-18 DIAGNOSIS — O209 Hemorrhage in early pregnancy, unspecified: Secondary | ICD-10-CM | POA: Diagnosis not present

## 2020-10-18 DIAGNOSIS — Z3A01 Less than 8 weeks gestation of pregnancy: Secondary | ICD-10-CM | POA: Diagnosis not present

## 2020-10-18 LAB — URINALYSIS, COMPLETE (UACMP) WITH MICROSCOPIC
Bacteria, UA: NONE SEEN
Bilirubin Urine: NEGATIVE
Glucose, UA: NEGATIVE mg/dL
Ketones, ur: NEGATIVE mg/dL
Leukocytes,Ua: NEGATIVE
Nitrite: NEGATIVE
Protein, ur: NEGATIVE mg/dL
Specific Gravity, Urine: 1.017 (ref 1.005–1.030)
pH: 6 (ref 5.0–8.0)

## 2020-10-18 LAB — BASIC METABOLIC PANEL
Anion gap: 8 (ref 5–15)
BUN: 13 mg/dL (ref 6–20)
CO2: 23 mmol/L (ref 22–32)
Calcium: 8.8 mg/dL — ABNORMAL LOW (ref 8.9–10.3)
Chloride: 106 mmol/L (ref 98–111)
Creatinine, Ser: 0.83 mg/dL (ref 0.44–1.00)
GFR, Estimated: >60 mL/min (ref >60)
Glucose, Bld: 117 mg/dL — ABNORMAL HIGH (ref 70–99)
Potassium: 3.9 mmol/L (ref 3.5–5.1)
Sodium: 137 mmol/L (ref 135–145)

## 2020-10-18 LAB — CBC
HCT: 40.3 % (ref 36.0–46.0)
Hemoglobin: 12.8 g/dL (ref 12.0–15.0)
MCH: 27.5 pg (ref 26.0–34.0)
MCHC: 31.8 g/dL (ref 30.0–36.0)
MCV: 86.7 fL (ref 80.0–100.0)
Platelets: 222 10*3/uL (ref 150–400)
RBC: 4.65 MIL/uL (ref 3.87–5.11)
RDW: 13.1 % (ref 11.5–15.5)
WBC: 7.8 10*3/uL (ref 4.0–10.5)
nRBC: 0 % (ref 0.0–0.2)

## 2020-10-18 LAB — HCG, QUANTITATIVE, PREGNANCY: hCG, Beta Chain, Quant, S: 827 m[IU]/mL — ABNORMAL HIGH (ref <5)

## 2020-10-18 LAB — ABO/RH: ABO/RH(D): A POS

## 2020-10-18 NOTE — ED Provider Notes (Signed)
[facility] Emergency Department Provider Note  ____________________________________________   Event Date/Time   First MD Initiated Contact with Patient 10/18/20 1112     (approximate)  I have reviewed the triage vital signs and the nursing notes.   HISTORY  Chief Complaint Vaginal Bleeding    HPI Summer Powers is a 30 y.o. female presents emergency department complaining of vaginal bleeding with a positive home pregnancy test.  Patient states she has had 4 abortions 3 by [facility] and 1 by the pill.  Also has had 1 miscarriage.  States that she had a positive pregnancy test few days ago.  Unsure of when her last period is states that was sometime in January.  She denies any vaginal discharge.    Past Medical History:  Diagnosis Date  . Abortion   . Miscarriage     Patient Active Problem List   Diagnosis Date Noted  . Hidradenitis suppurativa 09/13/2018    Past Surgical History:  Procedure Laterality Date  . HAND SURGERY Right     Prior to Admission medications   Medication Sig Start Date End Date Taking? Authorizing Provider  Prenatal Vit-Fe Fumarate-FA (MULTIVITAMIN-PRENATAL) 27-0.8 MG TABS tablet Take 1 tablet by mouth daily at 12 noon.    [provider]  cetirizine (ZYRTEC) 10 MG tablet Take 1 tablet (10 mg total) by mouth daily. Patient not taking: Reported on 12/22/2019 10/19/17 10/18/20  Zachery Dauer, NP  sodium chloride (OCEAN) 0.65 % SOLN nasal spray Place 1 spray into both nostrils as needed for congestion. Patient not taking: Reported on 12/22/2019 10/19/17 10/18/20  Zachery Dauer, NP    Allergies Patient has no known allergies.  Family History  Problem Relation Age of Onset  . Congestive Heart Failure Father   . COPD Father   . Lupus Maternal Grandmother   . Alcohol abuse Mother     Social History Social History   Tobacco Use  . Smoking status: Current Every Day Smoker    Packs/day: 1.00    Years: 11.00    Pack years:  11.00    Types: Cigarettes    Last attempt to quit: 09/2017    Years since quitting: 3.0  . Smokeless tobacco: Never Used  Vaping Use  . Vaping Use: Never used  Substance Use Topics  . Alcohol use: Yes    Comment: drinks about 1/5 when off of work, reports this is about 6 times per month  . Drug use: No    Review of Systems  Constitutional: No fever/chills Eyes: No visual changes. ENT: No sore throat. Respiratory: Denies cough Cardiovascular: Denies chest pain Gastrointestinal: Denies abdominal pain Genitourinary: Negative for dysuria.  Positive vaginal bleeding Musculoskeletal: Negative for back pain. Skin: Negative for rash. Psychiatric: no mood changes,     ____________________________________________   PHYSICAL EXAM:  VITAL SIGNS: ED Triage Vitals  Enc Vitals Group     BP 10/18/20 0930 130/76     Pulse Rate 10/18/20 0930 87     Resp 10/18/20 0930 17     Temp 10/18/20 0930 98.6 F (37 C)     Temp Source 10/18/20 0930 Oral     SpO2 10/18/20 0930 100 %     Weight 10/18/20 1113 171 lb 15.3 oz (78 kg)     Height 10/18/20 1113 5\' 3"  (1.6 m)     Head Circumference --      Peak Flow --      Pain Score --      Pain  Loc --      Pain Edu? --      Excl. in GC? --     Constitutional: Alert and oriented. Well appearing and in no acute distress. Eyes: Conjunctivae are normal.  Head: Atraumatic. Nose: No congestion/rhinnorhea. Mouth/Throat: Mucous membranes are moist.   Neck:  supple no lymphadenopathy noted Cardiovascular: Normal rate, regular rhythm. Heart sounds are normal Respiratory: Normal respiratory effort.  No retractions, lungs c t a  Abd: soft nontender bs normal all 4 quad GU: deferred by the patient Musculoskeletal: FROM all extremities, warm and well perfused Neurologic:  Normal speech and language.  Skin:  Skin is warm, dry and intact. No rash noted. Psychiatric: Mood and affect are normal. Speech and behavior are  normal.  ____________________________________________   LABS (all labs ordered are listed, but only abnormal results are displayed)  Labs Reviewed  BASIC METABOLIC PANEL - Abnormal; Notable for the following components:      Result Value   Glucose, Bld 117 (*)    Calcium 8.8 (*)    All other components within normal limits  HCG, QUANTITATIVE, PREGNANCY - Abnormal; Notable for the following components:   hCG, Beta Chain, Quant, S 827 (*)    All other components within normal limits  URINALYSIS, COMPLETE (UACMP) WITH MICROSCOPIC - Abnormal; Notable for the following components:   Color, Urine YELLOW (*)    APPearance CLEAR (*)    Hgb urine dipstick MODERATE (*)    All other components within normal limits  CBC  ABO/RH   ____________________________________________   ____________________________________________  RADIOLOGY  Ultrasound of the uterus 14 weeks  ____________________________________________   PROCEDURES  Procedure(s) performed: No  Procedures    ____________________________________________   INITIAL IMPRESSION / ASSESSMENT AND PLAN / ED COURSE  Pertinent labs & imaging results that were available during my care of the patient were reviewed by me and considered in my medical decision making (see chart for details).   Patient is 30 year old female presents with positive home pregnancy test vaginal bleeding.  See HPI.  Physical exam shows patient per stable.  Vitals normal.  DDx: Threatened miscarriage, blighted ovum, ectopic pregnancy, molar pregnancy  CBC is normal, basic metabolic panel is normal, beta hCG is 827, ABO/Rh is A+   Ultrasound OB less than 14 weeks shows a gestational sac that is not attached to the endometrium, it is floating, indicates a nonviable pregnancy  Did explain this to the patient.  I explained her she will need to get a repeat beta hCG in 1 week to see if her numbers are decreasing.  Return emergency department worsening.   She states she understands.  She is discharged stable condition with a work note.  Summer Powers was evaluated in Emergency Department on 10/18/2020 for the symptoms described in the history of present illness. She was evaluated in the context of the global COVID-19 pandemic, which necessitated consideration that the patient might be at risk for infection with the SARS-CoV-2 virus that causes COVID-19. Institutional protocols and algorithms that pertain to the evaluation of patients at risk for COVID-19 are in a state of rapid change based on information released by regulatory bodies including the CDC and federal and state organizations. These policies and algorithms were followed during the patient's care in the ED.    As part of my medical decision making, I reviewed the following data within the electronic MEDICAL RECORD NUMBER Nursing notes reviewed and incorporated, Labs reviewed , Old chart reviewed, Radiograph reviewed , Notes from  prior ED visits and [place] Controlled Substance Database  ____________________________________________   FINAL CLINICAL IMPRESSION(S) / ED DIAGNOSES  Final diagnoses:  Threatened miscarriage      NEW MEDICATIONS STARTED DURING THIS VISIT:  Discharge Medication List as of 10/18/2020  1:20 PM       Note:  This document was prepared using Dragon voice recognition software and may include unintentional dictation errors.    Faythe Ghee, PA-C 10/18/20 1407    Gilles Chiquito, MD 10/18/20 1739

## 2020-10-18 NOTE — ED Notes (Signed)
Back from us

## 2020-10-18 NOTE — ED Notes (Signed)
Pelvic cart set up at bedside  

## 2020-10-18 NOTE — ED Triage Notes (Signed)
Pt states she had a positive home pregnancy test last Monday and today having vaginal bleeding with cramping.

## 2020-10-18 NOTE — Discharge Instructions (Addendum)
You will need to have a repeat beta hCG test done in 1 week.  Please follow-up with [facility] to obtain this test.  Your regular physician could also do this.  Return emergency department worsening.

## 2020-10-18 NOTE — ED Notes (Signed)
Patient declines pelvic exam. Cart removed from room.

## 2020-10-18 NOTE — ED Notes (Signed)
See triage note  Presents with some vaginal bleeding  States shse did have a positive home pregnancy test   Developed bleeding

## 2021-03-01 ENCOUNTER — Encounter: Payer: Self-pay | Admitting: Family Medicine

## 2021-03-01 ENCOUNTER — Ambulatory Visit (LOCAL_COMMUNITY_HEALTH_CENTER): Payer: 59 | Admitting: Family Medicine

## 2021-03-01 ENCOUNTER — Other Ambulatory Visit: Payer: Self-pay

## 2021-03-01 VITALS — BP 117/81 | Ht 63.0 in | Wt 179.0 lb

## 2021-03-01 DIAGNOSIS — Z113 Encounter for screening for infections with a predominantly sexual mode of transmission: Secondary | ICD-10-CM

## 2021-03-01 DIAGNOSIS — Z3009 Encounter for other general counseling and advice on contraception: Secondary | ICD-10-CM | POA: Diagnosis not present

## 2021-03-01 DIAGNOSIS — Z01419 Encounter for gynecological examination (general) (routine) without abnormal findings: Secondary | ICD-10-CM

## 2021-03-01 DIAGNOSIS — Z1272 Encounter for screening for malignant neoplasm of vagina: Secondary | ICD-10-CM

## 2021-03-01 LAB — WET PREP FOR TRICH, YEAST, CLUE
Trichomonas Exam: NEGATIVE
Yeast Exam: NEGATIVE

## 2021-03-01 NOTE — Progress Notes (Signed)
Wet mount results reviewed, no treatment required. Berdie Ogren, RN

## 2021-03-01 NOTE — Progress Notes (Signed)
Pt to clinic for physical and STD screening. Pt plans to continue to use condoms for birth  control. Pt c/o of vaginal odor; pt requests vaginal gel for tx of BV.

## 2021-03-04 LAB — IGP, APTIMA HPV
HPV Aptima: NEGATIVE
PAP Smear Comment: 0

## 2021-03-04 LAB — HEPATITIS B SURFACE ANTIGEN

## 2021-03-04 NOTE — Progress Notes (Signed)
Family Planning Visit- Repeat Yearly Visit  Subjective:  Summer Powers is a 30 y.o. G6P0060  being seen today for an annual wellness visit and to discuss contraception options.   The patient is currently using None for pregnancy prevention. Patient does not want a pregnancy in the next year. Patient has the following medical problems: has Hidradenitis suppurativa on their problem list.  Chief Complaint  Patient presents with   Annual Exam    Patient reports here for physical and STI screening   Patient denies any other concerns.     See flowsheet for other program required questions.   Body mass index is 31.71 kg/m. - Patient is eligible for diabetes screening based on BMI and age >66?  not applicable HA1C ordered? not applicable  Patient reports 2 of partners in last year. Desires STI screening?  Yes   Has patient been screened once for HCV in the past?  No  No results found for: HCVAB  Does the patient have current of drug use, have a partner with drug use, and/or has been incarcerated since last result? Yes  If yes-- Screen for HCV through [facility]   Does the patient meet criteria for HBV testing? Yes  Criteria:  -Household, sexual or needle sharing contact with HBV -History of drug use -HIV positive -Those with known Hep C   Health Maintenance Due  Topic Date Due   COVID-19 Vaccine (1) Never done   Pneumococcal Vaccine 29-50 Years old (1 - PCV) Never done   TETANUS/TDAP  Never done   PAP SMEAR-Modifier  08/03/2019    Review of Systems  Constitutional:  Negative for chills, fever, malaise/fatigue and weight loss.  HENT:  Negative for congestion, hearing loss and sore throat.   Eyes:  Negative for blurred vision, double vision and photophobia.  Respiratory:  Negative for shortness of breath.   Cardiovascular:  Negative for chest pain.  Gastrointestinal:  Negative for abdominal pain, blood in stool, constipation, diarrhea, heartburn, nausea and vomiting.   Genitourinary:  Negative for dysuria and frequency.  Musculoskeletal:  Negative for back pain, joint pain and neck pain.  Skin:  Negative for itching and rash.  Neurological:  Negative for dizziness, weakness and headaches.  Endo/Heme/Allergies:  Does not bruise/bleed easily.  Psychiatric/Behavioral:  Negative for depression, substance abuse and suicidal ideas.    The following portions of the patient's history were reviewed and updated as appropriate: allergies, current medications, past family history, past medical history, past social history, past surgical history and problem list. Problem list updated.  Objective:   Vitals:   03/01/21 1505  BP: 117/81  Weight: 179 lb (81.2 kg)  Height: 5\' 3"  (1.6 m)    Physical Exam Vitals and nursing note reviewed.  Constitutional:      Appearance: Normal appearance.  HENT:     Head: Normocephalic and atraumatic.     Mouth/Throat:     Mouth: Mucous membranes are moist.     Dentition: Normal dentition. No dental caries.     Pharynx: No oropharyngeal exudate or posterior oropharyngeal erythema.  Eyes:     General: No scleral icterus. Neck:     Thyroid: No thyroid mass or thyromegaly.  Cardiovascular:     Rate and Rhythm: Normal rate.     Pulses: Normal pulses.  Pulmonary:     Effort: Pulmonary effort is normal.  Chest:     Comments: Breasts:        Right: Normal. No swelling, mass, nipple discharge, skin  change or tenderness.        Left: Normal. No swelling, mass, nipple discharge, skin change or tenderness.   Abdominal:     General: Abdomen is flat. Bowel sounds are normal.     Palpations: Abdomen is soft.  Musculoskeletal:        General: Normal range of motion.  Skin:    General: Skin is warm and dry.  Neurological:     General: No focal deficit present.     Mental Status: She is alert.      Assessment and Plan:  Summer Powers is a 30 y.o. female G6P0060 presenting to the [facility] Department for an  yearly wellness and contraception visit  Contraception counseling: Reviewed all forms of birth control options in the tiered based approach. available including abstinence; over the counter/barrier methods; hormonal contraceptive medication including pill, patch, ring, injection,contraceptive implant, ECP; hormonal and nonhormonal IUDs; permanent sterilization options including vasectomy and the various tubal sterilization modalities. Risks, benefits, and typical effectiveness rates were reviewed.  Questions were answered.  Written information was also given to the patient to review.  Patient desires no BCM.   She will follow up in  as needed when ready for [facility].  She was told to call with any further questions, or with any concerns about any method of contraception.  Emphasized use of condoms 100% of the time for STI prevention.  Patient was offered ECP based on no BCM. ECP was not accepted by the patient. ECP counseling was not given.     1. Smear, vaginal, as part of routine gynecological examination Well woman exam  Pap today  CBE today- discussed self breast exam with teach back.    - IGP, Aptima HPV  2. Screening examination for venereal disease  Reports odor  - Chlamydia/Gonorrhea [place] - HBV Antigen/Antibody [facility] - HIV/HCV [place] [facility] - Syphilis Serology, [facility] - WET PREP FOR TRICH, YEAST, CLUE - Chlamydia/Gonorrhea  Lab Patient accepted all screenings including wet prep, oral, vaginal CT/GC and bloodwork for HIV/RPR.  Patient meets criteria for HepB screening? Yes. Ordered? Yes Patient meets criteria for HepC screening? Yes. Ordered? Yes  Wet prep results neg   No Treatment needed  Discussed time line for State Lab results and that patient will be called with positive results and encouraged patient to call if she had not heard in 2 weeks.  Counseled to return or seek care for continued or worsening symptoms Recommended condom use with all  sex  Patient is currently using  No method  to prevent pregnancy.      Return for as needed.  No future appointments.  Wendi Snipes, FNP

## 2021-03-07 LAB — HM HIV SCREENING LAB: HM HIV Screening: NEGATIVE

## 2021-04-04 NOTE — Addendum Note (Signed)
Addended by: Wendi Snipes on: 04/04/2021 02:42 PM   Modules accepted: Orders

## 2021-05-05 DIAGNOSIS — H40053 Ocular hypertension, bilateral: Secondary | ICD-10-CM | POA: Diagnosis not present

## 2021-05-18 NOTE — Addendum Note (Signed)
Addended by: Wendi Snipes on: 05/18/2021 12:12 PM   Modules accepted: Orders

## 2021-07-01 ENCOUNTER — Other Ambulatory Visit: Payer: Self-pay

## 2021-07-01 MED ORDER — IBUPROFEN 800 MG PO TABS
ORAL_TABLET | ORAL | 0 refills | Status: DC
  Filled 2021-07-01: qty 20, 7d supply, fill #0

## 2021-11-10 DIAGNOSIS — H5213 Myopia, bilateral: Secondary | ICD-10-CM | POA: Diagnosis not present

## 2022-01-03 ENCOUNTER — Encounter: Payer: Self-pay | Admitting: Obstetrics and Gynecology

## 2022-01-03 ENCOUNTER — Telehealth (INDEPENDENT_AMBULATORY_CARE_PROVIDER_SITE_OTHER): Payer: 59 | Admitting: Obstetrics and Gynecology

## 2022-01-03 ENCOUNTER — Other Ambulatory Visit: Payer: Self-pay

## 2022-01-03 VITALS — BP 128/79 | HR 66 | Ht 63.0 in | Wt 179.1 lb

## 2022-01-03 DIAGNOSIS — Z3202 Encounter for pregnancy test, result negative: Secondary | ICD-10-CM | POA: Diagnosis not present

## 2022-01-03 DIAGNOSIS — Z30011 Encounter for initial prescription of contraceptive pills: Secondary | ICD-10-CM | POA: Diagnosis not present

## 2022-01-03 LAB — POCT URINE PREGNANCY: Preg Test, Ur: NEGATIVE

## 2022-01-03 MED ORDER — NORETHINDRONE ACETATE 5 MG PO TABS
5.0000 mg | ORAL_TABLET | Freq: Every day | ORAL | 0 refills | Status: DC
  Filled 2022-01-03: qty 30, 30d supply, fill #0

## 2022-01-03 NOTE — Progress Notes (Unsigned)
    Virtual Visit via Telephone Note  I connected with Summer Powers on 01/05/22 at  4:30 PM EDT by telephone and verified that I am speaking with the correct person using two identifiers.  Location: Patient: Work Provider: Office   I discussed the limitations, risks, security and privacy concerns of performing an evaluation and management service by telephone and the availability of in person appointments. I also discussed with the patient that there may be a patient responsible charge related to this service. The patient expressed understanding and agreed to proceed.   History of Present Illness:  Patient is a 31 y.o. G53P0060 female who presents for contraception management. She was on Depo Provera in the past, but has not been on anything since then. She would like to discuss taking oral contraception that prevents her from having a cycle. Her LMP 12/13/2021. She plans on going on vacation in [place] soon and does not want to have her cycle doing that time. Does report a history of heavy cycles, typically last 4-5 days. First 2 days are the heaviest.    The following portions of the patient's history were reviewed and updated as appropriate:  She  has a past medical history of Abortion and Miscarriage.  She  has a past surgical history that includes Hand surgery (Right).  Her family history includes Alcohol abuse in her mother; COPD in her father; Congestive Heart Failure in her father; Lupus in her maternal grandmother.  She  reports that she has been smoking e-cigarettes. She has never used smokeless tobacco. She reports current alcohol use. She reports current drug use. Drug: Marijuana.   Current Outpatient Medications on File Prior to Visit  Medication Sig Dispense Refill   ibuprofen (ADVIL) 800 MG tablet Take 1 tablet by mouth every 8 hours as needed for pain 20 tablet 0   [DISCONTINUED] cetirizine (ZYRTEC) 10 MG tablet Take 1 tablet (10 mg total) by mouth daily. (Patient not  taking: Reported on 12/22/2019) 30 tablet 0   [DISCONTINUED] sodium chloride (OCEAN) 0.65 % SOLN nasal spray Place 1 spray into both nostrils as needed for congestion. (Patient not taking: Reported on 12/22/2019) 1 Bottle 0   No current facility-administered medications on file prior to visit.   She has No Known Allergies..  Review of Systems Pertinent items are noted in HPI.    Observations/Objective: Blood pressure 128/79, pulse 66, height 5\' 3"  (1.6 m), weight 179 lb 1.6 oz (81.2 kg). Gen App: NAD.  Psych: normal mood, speech,  Remainder of exam deferred.   Assessment and Plan: Discussed different management options, including contraception vs non-contraceptive hormonal suppression. Patient ok with non-contraceptive option as management of her cycle will be short term. Offered Aygestin 5 mg, daily. Patient ok to receive. Will send prescription.   Follow Up Instructions: Follow up as needed if further management of menses is desired.    I discussed the assessment and treatment plan with the patient. The patient was provided an opportunity to ask questions and all were answered. The patient agreed with the plan and demonstrated an understanding of the instructions.   The patient was advised to call back or seek an in-person evaluation if the symptoms worsen or if the condition fails to improve as anticipated.  I provided 8 minutes of non-face-to-face time during this encounter.   , MD [facility]

## 2022-01-05 ENCOUNTER — Encounter: Payer: Self-pay | Admitting: Obstetrics and Gynecology

## 2022-03-08 ENCOUNTER — Encounter: Payer: Self-pay | Admitting: Nurse Practitioner

## 2022-03-08 ENCOUNTER — Ambulatory Visit (LOCAL_COMMUNITY_HEALTH_CENTER): Payer: 59 | Admitting: Nurse Practitioner

## 2022-03-08 ENCOUNTER — Telehealth: Payer: Self-pay | Admitting: Family Medicine

## 2022-03-08 ENCOUNTER — Other Ambulatory Visit: Payer: Self-pay

## 2022-03-08 VITALS — BP 127/85 | Ht 63.0 in | Wt 187.0 lb

## 2022-03-08 DIAGNOSIS — Z01419 Encounter for gynecological examination (general) (routine) without abnormal findings: Secondary | ICD-10-CM

## 2022-03-08 DIAGNOSIS — A599 Trichomoniasis, unspecified: Secondary | ICD-10-CM | POA: Diagnosis not present

## 2022-03-08 DIAGNOSIS — Z113 Encounter for screening for infections with a predominantly sexual mode of transmission: Secondary | ICD-10-CM | POA: Diagnosis not present

## 2022-03-08 DIAGNOSIS — Z3009 Encounter for other general counseling and advice on contraception: Secondary | ICD-10-CM

## 2022-03-08 LAB — WET PREP FOR TRICH, YEAST, CLUE
Trichomonas Exam: POSITIVE — AB
Yeast Exam: NEGATIVE

## 2022-03-08 LAB — HM HIV SCREENING LAB: HM HIV Screening: NEGATIVE

## 2022-03-08 LAB — HM HEPATITIS C SCREENING LAB: HM Hepatitis Screen: NEGATIVE

## 2022-03-08 LAB — HEPATITIS B SURFACE ANTIGEN: Hepatitis B Surface Ag: NONREACTIVE

## 2022-03-08 MED ORDER — METRONIDAZOLE 500 MG PO TABS: 500.0000 mg | ORAL_TABLET | Freq: Two times a day (BID) | ORAL | 0 refills | Status: AC

## 2022-03-08 MED ORDER — NORGESTIMATE-ETH ESTRADIOL 0.25-35 MG-MCG PO TABS
1.0000 | ORAL_TABLET | Freq: Every day | ORAL | 5 refills | Status: DC
  Filled 2022-03-08: qty 84, 84d supply, fill #0
  Filled 2022-03-30: qty 84, 63d supply, fill #0

## 2022-03-08 NOTE — Progress Notes (Signed)
WET PREP reveals Trichomonas. Treated per standing orders. Contact card given. OCP called into pharmacy by provider. Pt verbalized understanding of further labwork pending.  Bwiedenheft RN

## 2022-03-08 NOTE — Telephone Encounter (Signed)
Pt called regarding the prescription to be sent to the pharmacy. She will be waiting. Please send as soon as you can. Thanks

## 2022-03-08 NOTE — Progress Notes (Addendum)
[facility] DEPARTMENT [facility] [address] Main Number: [phone]    Family Planning Visit- Initial Visit  Subjective:  Summer Powers is a 31 y.o.  G6P0060   being seen today for an initial annual visit and to discuss reproductive life planning.  The patient is currently using No Method - Other Reason for pregnancy prevention. Patient reports   does not want a pregnancy in the next year.  Desires Depo today.    report they are looking for a method that provides High efficacy at preventing pregnancy  Patient has the following medical conditions has Hidradenitis suppurativa on their problem list.  Chief Complaint  Patient presents with   Annual Exam    PE, STD testing, and BCM    Patient reports to clinic today for a physical, STD screening, and birth control.      Body mass index is 33.13 kg/m. - Patient is eligible for diabetes screening based on BMI and age >81?  not applicable HA1C ordered? not applicable  Patient reports 1  partner/s in last year. Desires STI screening?  Yes  Has patient been screened once for HCV in the past?  Yes  No results found for: "HCVAB"  Does the patient have current drug use (including MJ), have a partner with drug use, and/or has been incarcerated since last result? No  If yes-- Screen for HCV through [facility]   Does the patient meet criteria for HBV testing? Yes  Criteria:  -Household, sexual or needle sharing contact with HBV -History of drug use -HIV positive -Those with known Hep C   Health Maintenance Due  Topic Date Due   COVID-19 Vaccine (1) Never done    Review of Systems  Constitutional:  Negative for chills, fever, malaise/fatigue and weight loss.  HENT:  Negative for congestion, hearing loss and sore throat.   Eyes:  Negative for blurred vision, double vision and photophobia.  Respiratory:  Negative for shortness of breath.   Cardiovascular:  Negative for chest pain.   Gastrointestinal:  Negative for abdominal pain, blood in stool, constipation, diarrhea, heartburn, nausea and vomiting.  Genitourinary:  Negative for dysuria and frequency.  Musculoskeletal:  Negative for back pain, joint pain and neck pain.  Skin:  Negative for itching and rash.  Neurological:  Negative for dizziness, weakness and headaches.  Endo/Heme/Allergies:  Does not bruise/bleed easily.  Psychiatric/Behavioral:  Negative for depression, substance abuse and suicidal ideas.     The following portions of the patient's history were reviewed and updated as appropriate: allergies, current medications, past family history, past medical history, past social history, past surgical history and problem list. Problem list updated.   See flowsheet for other program required questions.  Objective:   Vitals:   03/08/22 1028  BP: 127/85  Weight: 187 lb (84.8 kg)  Height: 5\' 3"  (1.6 m)    Physical Exam Constitutional:      Appearance: Normal appearance.  HENT:     Head: Normocephalic. No abrasion, masses or laceration. Hair is normal.     Jaw: No tenderness or swelling.     Right Ear: External ear normal.     Left Ear: External ear normal.     Nose: Nose normal.     Mouth/Throat:     Lips: Pink. No lesions.     Mouth: Mucous membranes are moist. No lacerations or oral lesions.     Dentition: No dental caries.     Tongue: No lesions.  Palate: No mass and lesions.     Pharynx: No pharyngeal swelling, oropharyngeal exudate, posterior oropharyngeal erythema or uvula swelling.     Tonsils: No tonsillar exudate or tonsillar abscesses.  Eyes:     Pupils: Pupils are equal, round, and reactive to light.  Neck:     Thyroid: No thyroid mass, thyromegaly or thyroid tenderness.  Cardiovascular:     Rate and Rhythm: Normal rate and regular rhythm.  Pulmonary:     Effort: Pulmonary effort is normal.     Breath sounds: Normal breath sounds.  Chest:  Breasts:    Right: Normal. No  swelling, mass, nipple discharge, skin change or tenderness.     Left: Normal. No swelling, mass, nipple discharge, skin change or tenderness.  Abdominal:     General: Abdomen is flat. Bowel sounds are normal.     Palpations: Abdomen is soft.     Tenderness: There is no abdominal tenderness. There is no rebound.  Genitourinary:    Pubic Area: No rash or pubic lice.      Labia:        Right: No rash, tenderness or lesion.        Left: No rash, tenderness or lesion.      Vagina: Normal. No vaginal discharge, erythema, tenderness or lesions.     Cervix: No cervical motion tenderness, discharge, lesion or erythema.     Uterus: Normal.      Adnexa:        Right: No tenderness.         Left: No tenderness.       Rectum: Normal.     Comments: Amount Discharge: small  Odor: No pH: greater than 4.5 Adheres to vaginal wall: No Color: red patient currently on menses  Musculoskeletal:     Cervical back: Full passive range of motion without pain and normal range of motion.  Lymphadenopathy:     Cervical: No cervical adenopathy.     Right cervical: No superficial, deep or posterior cervical adenopathy.    Left cervical: No superficial, deep or posterior cervical adenopathy.     Upper Body:     Right upper body: No supraclavicular, axillary or epitrochlear adenopathy.     Left upper body: No supraclavicular, axillary or epitrochlear adenopathy.     Lower Body: No right inguinal adenopathy. No left inguinal adenopathy.  Skin:    General: Skin is warm and dry.     Findings: No erythema, laceration, lesion or rash.  Neurological:     Mental Status: She is alert and oriented to person, place, and time.  Psychiatric:        Attention and Perception: Attention normal.        Mood and Affect: Mood normal.        Speech: Speech normal.        Behavior: Behavior normal. Behavior is cooperative.       Assessment and Plan:  Summer Powers is a 31 y.o. female presenting to the [facility] Department for an initial annual wellness/contraceptive visit  Contraception counseling: Reviewed options based on patient desire and reproductive life plan. Patient is interested in Oral Contraceptive. This was provided to the patient today.   Risks, benefits, and typical effectiveness rates were reviewed.  Questions were answered.  Written information was also given to the patient to review.    The patient will follow up in  1 years for surveillance.  The patient was told to call with any further questions,  or with any concerns about this method of contraception.  Emphasized use of condoms 100% of the time for STI prevention.  Need for ECP was assessed.  ECP not offered due to last sex.  1. Family planning counseling -31 year old female in clinic today for a physical, STD screening, and birth control. -Reviewed all medications and allergies.  -ROS reviewed, no complaints noted. -Patient desires to start OCPs and would like to do continuous dosing.   - norgestimate-ethinyl estradiol (ORTHO-CYCLEN) 0.25-35 MG-MCG tablet; Take 1 tablet by mouth daily. Patient desires continuous dosing please allow patient to pick up prescription early.  Dispense: 84 tablet; Refill: 5  2. Well woman exam with routine gynecological exam -Normal well woman exam. -CBE today, next due 02/2025 -PAP due 02/2024  3. Screening examination for venereal disease -STD screening. -Patient accepted all screenings including vaginal CT/GC, wet prep and bloodwork for HIV/RPR.  Patient meets criteria for HepB screening? Yes. Ordered? Yes Patient meets criteria for HepC screening? Yes. Ordered? Yes  Treat wet prep per standing order Discussed time line for [facility] results and that patient will be called with positive results and encouraged patient to call if she had not heard in 2 weeks.  Counseled to return or seek care for continued or worsening symptoms Recommended condom use with all sex  Patient is currently  not using  contracdeption  to prevent pregnancy.    - HIV/HCV [facility] - Syphilis Serology, [facility] [facility] - HBV Antigen/Antibody [facility] - Chlamydia/Gonorrhea [place] [facility] - WET PREP FOR TRICH, YEAST, CLUE   4. Trichomonas infection -Wet prep reviewed, patient positive Trich.   - metroNIDAZOLE (FLAGYL) 500 MG tablet; Take 1 tablet (500 mg total) by mouth 2 (two) times daily for 7 days.  Dispense: 14 tablet; Refill: 0   Total time spent: 30 minutes   Return in about 1 year (around 03/09/2023) for Annual well-woman exam.    Glenna Fellows, FNP

## 2022-03-28 ENCOUNTER — Other Ambulatory Visit: Payer: Self-pay

## 2022-03-30 ENCOUNTER — Other Ambulatory Visit: Payer: Self-pay

## 2022-05-11 IMAGING — US US OB < 14 WEEKS - US OB TV
1 series · 13 of 28 positions shown · non-contrast
Comparison: None.

CLINICAL DATA: Vaginal bleeding for 2 days. Positive pregnancy
test.

EXAM:
OBSTETRIC <14 WK US AND TRANSVAGINAL OB US
TECHNIQUE: Both transabdominal and transvaginal ultrasound examinations were
performed for complete evaluation of the gestation as well as the
maternal uterus, adnexal regions, and pelvic cul-de-sac.
Transvaginal technique was performed to assess early pregnancy.

[Series 1: early ob us · arterial · 13 of 129 slices shown]
[im 5/129]
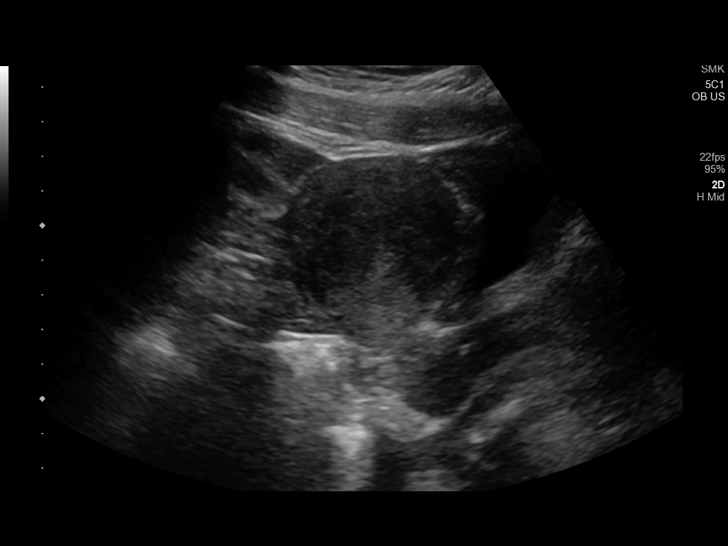
[im 15/129]
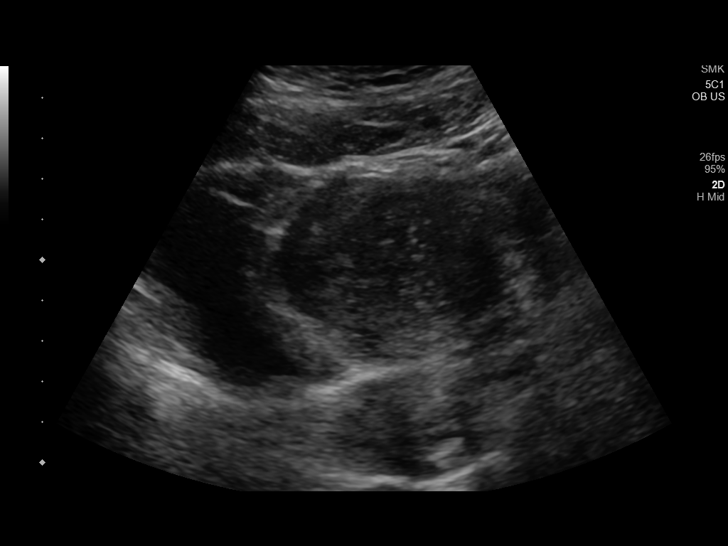
[im 24/129]
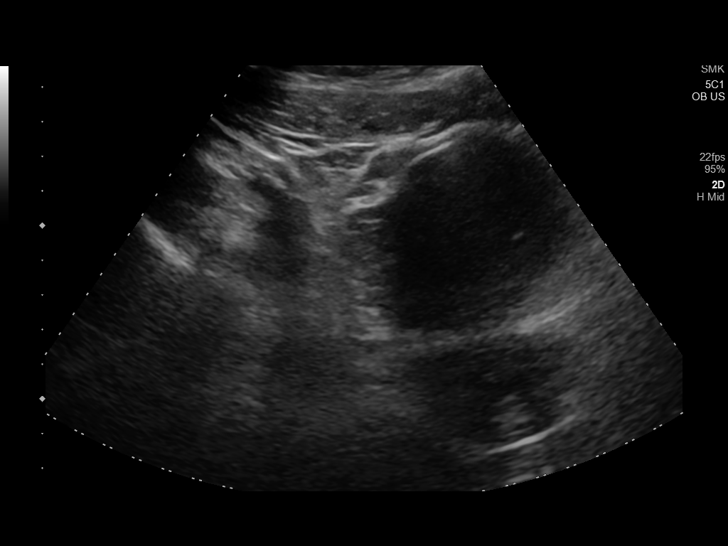
[im 34/129]
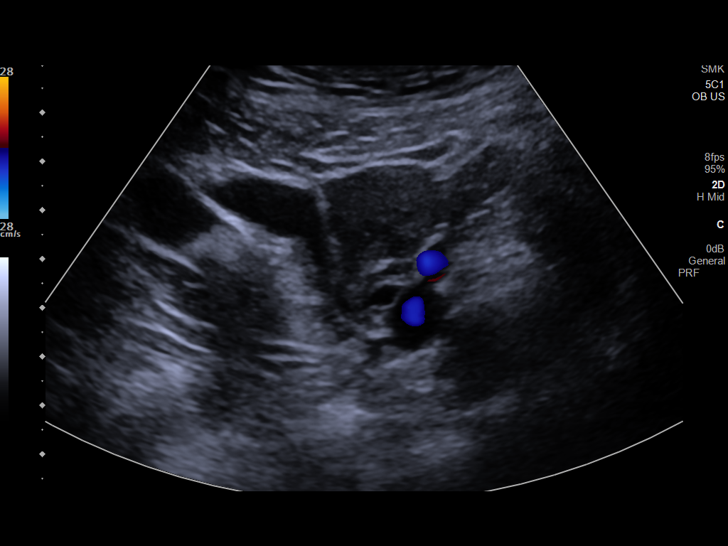
[im 43/129]
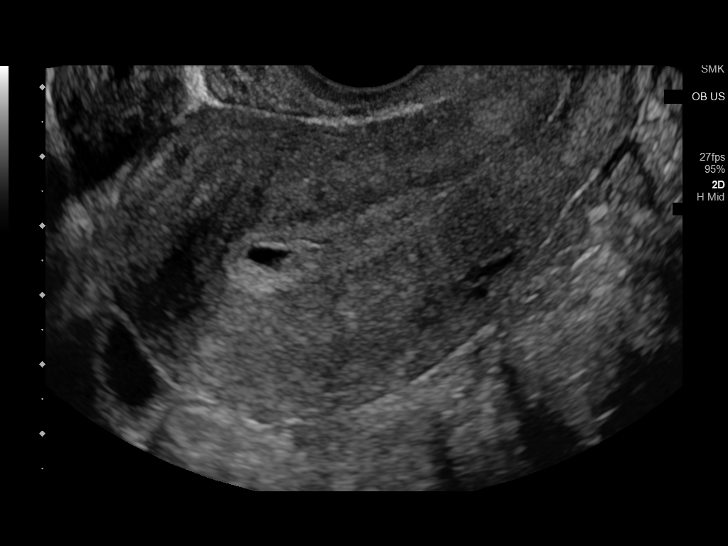
[im 53/129]
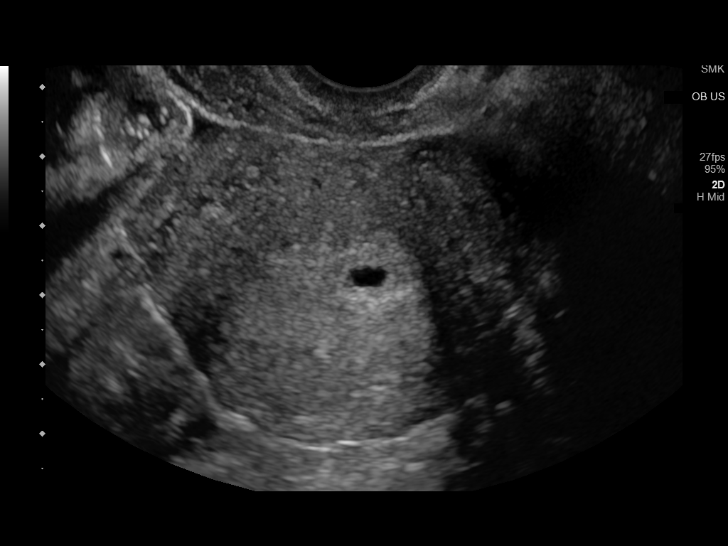
[im 67/129]
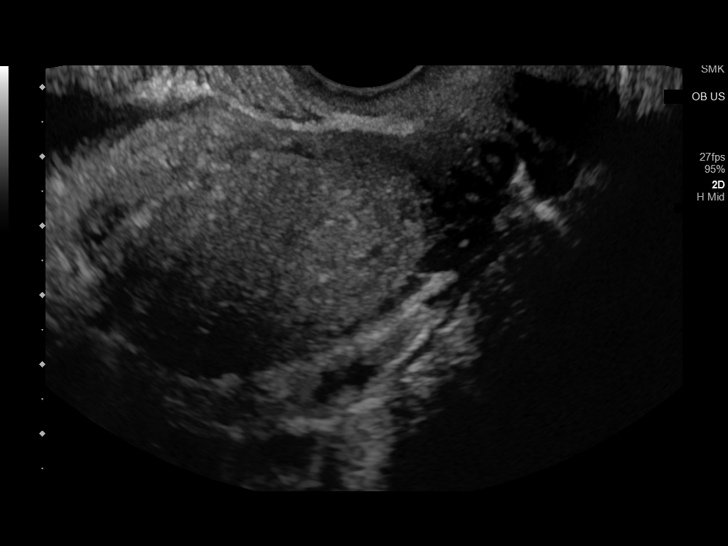
[im 76/129]
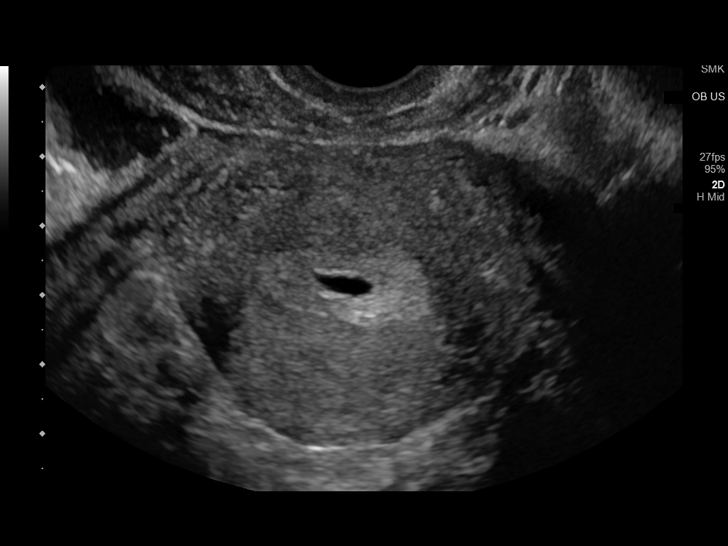
[im 86/129]
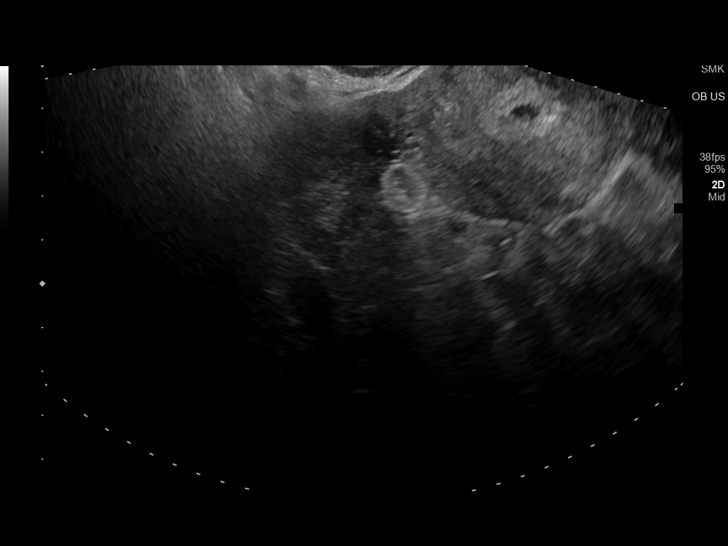
[im 95/129]
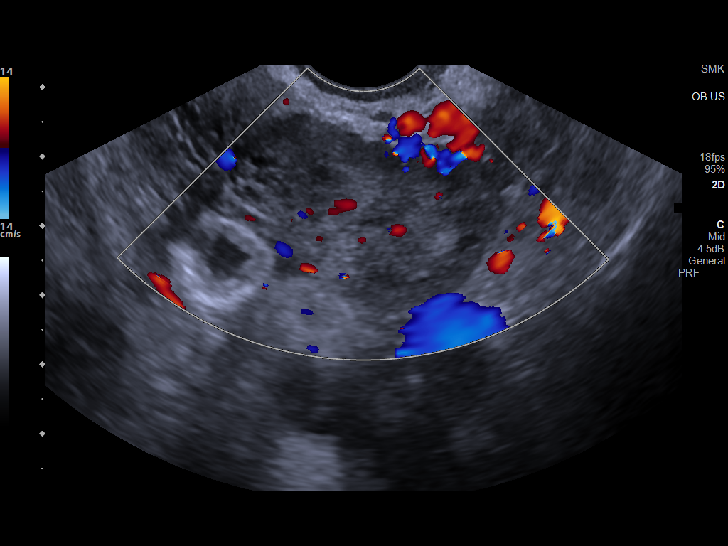
[im 105/129]
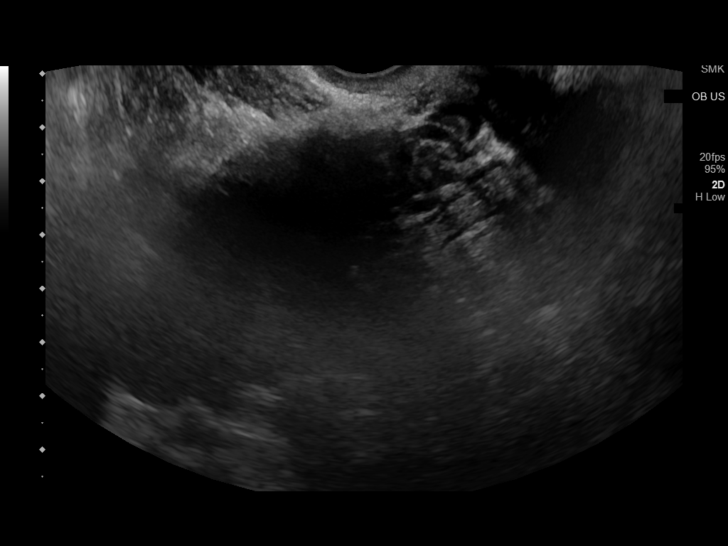
[im 114/129]
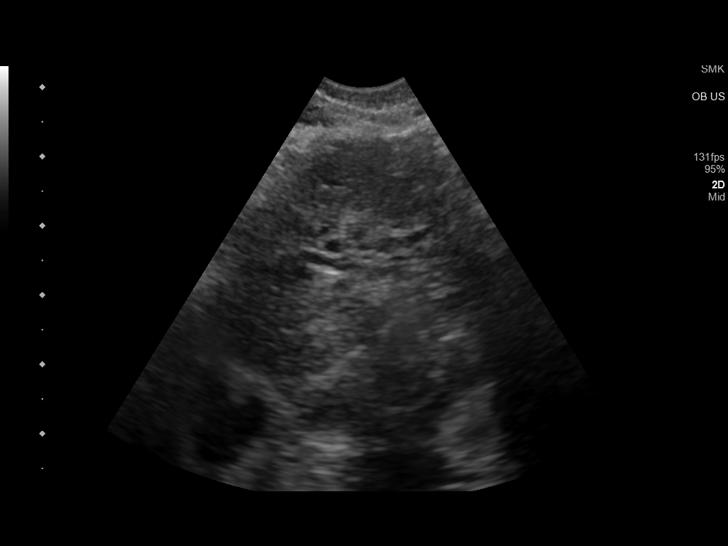
[im 124/129]
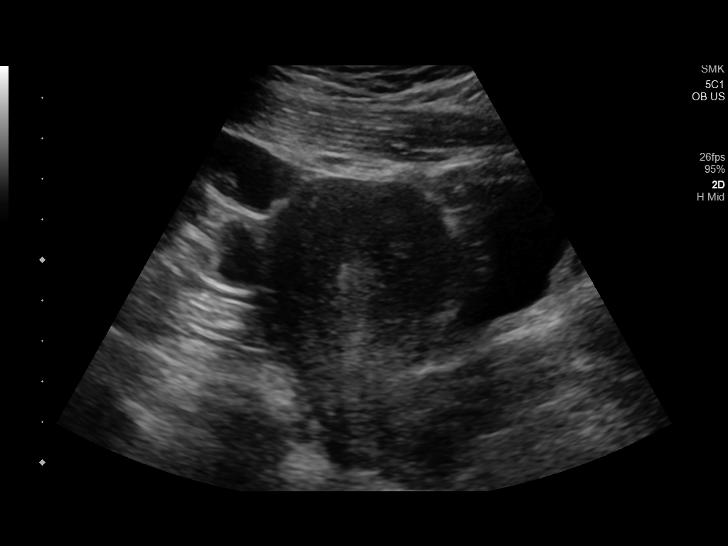

[13 of 28 positions shown; findings below may reference images not displayed]

FINDINGS: Intrauterine gestational sac: Single. Gestational sac appears mobile
within the endometrial cavity

Yolk sac:  Not Visualized.

Embryo:  Not Visualized.

Cardiac Activity: Not Visualized.

MSD: 6.5 mm   5 w   3 d

Subchorionic hemorrhage:  None visualized.

Maternal uterus/adnexae: The bilateral ovaries appear within normal
limits. No free fluid in the cul-de-sac.
IMPRESSION: Probable early intrauterine gestational sac, but no yolk sac, fetal
pole, or cardiac activity yet visualized. Gestational sac appears
mobile within the endometrial cavity, a finding suspicious for
non-viability. Recommend follow-up quantitative B-HCG levels and
follow-up US in 14 days to assess viability. This recommendation
follows SRU consensus guidelines: Diagnostic Criteria for Nonviable
Pregnancy Early in the First Trimester. N Engl J Med 2376;

## 2022-07-27 ENCOUNTER — Ambulatory Visit (INDEPENDENT_AMBULATORY_CARE_PROVIDER_SITE_OTHER): Payer: BLUE CROSS/BLUE SHIELD

## 2022-07-27 ENCOUNTER — Other Ambulatory Visit (HOSPITAL_COMMUNITY)
Admission: RE | Admit: 2022-07-27 | Discharge: 2022-07-27 | Disposition: A | Discharge status: Home / Self Care | Payer: 59 | Source: Ambulatory Visit | Attending: Obstetrics and Gynecology | Admitting: Obstetrics and Gynecology

## 2022-07-27 VITALS — BP 127/90 | Ht 62.0 in | Wt 189.0 lb

## 2022-07-27 DIAGNOSIS — N898 Other specified noninflammatory disorders of vagina: Secondary | ICD-10-CM | POA: Diagnosis not present

## 2022-07-27 NOTE — Progress Notes (Signed)
Patient presents today due to yeast/ BV symptoms. She states symptoms include vaginal odor more discharge and bleeding between cycles starting on last week. Self swab culture preformed. All questions asked, patient aware office will be in touch with results.

## 2022-07-28 ENCOUNTER — Other Ambulatory Visit: Payer: Self-pay

## 2022-07-28 MED ORDER — METRONIDAZOLE 500 MG PO TABS
500.0000 mg | ORAL_TABLET | Freq: Two times a day (BID) | ORAL | 0 refills | Status: DC
  Filled 2022-07-28: qty 14, 7d supply, fill #0

## 2022-08-01 LAB — CERVICOVAGINAL ANCILLARY ONLY
Bacterial Vaginitis (gardnerella): POSITIVE — AB
Candida Glabrata: NEGATIVE
Candida Vaginitis: NEGATIVE
Chlamydia: NEGATIVE
Comment: NEGATIVE
Comment: NEGATIVE
Comment: NEGATIVE
Comment: NEGATIVE
Comment: NEGATIVE
Comment: NORMAL
Neisseria Gonorrhea: NEGATIVE
Trichomonas: NEGATIVE

## 2022-11-07 ENCOUNTER — Other Ambulatory Visit: Payer: Self-pay

## 2022-11-07 DIAGNOSIS — J029 Acute pharyngitis, unspecified: Secondary | ICD-10-CM | POA: Diagnosis not present

## 2022-11-07 MED ORDER — AMOXICILLIN 875 MG PO TABS
875.0000 mg | ORAL_TABLET | Freq: Two times a day (BID) | ORAL | 0 refills | Status: DC
  Filled 2022-11-07: qty 20, 10d supply, fill #0

## 2022-11-28 ENCOUNTER — Other Ambulatory Visit: Payer: Self-pay | Admitting: Obstetrics and Gynecology

## 2022-11-28 ENCOUNTER — Other Ambulatory Visit: Payer: Self-pay

## 2022-11-28 MED ORDER — FLUCONAZOLE 150 MG PO TABS
150.0000 mg | ORAL_TABLET | Freq: Once | ORAL | 0 refills | Status: AC
  Filled 2022-11-28: qty 1, 1d supply, fill #0

## 2022-11-28 NOTE — Progress Notes (Signed)
Rx diflucan for yeast vag sx after abx ?

## 2023-02-01 ENCOUNTER — Other Ambulatory Visit: Payer: Self-pay | Admitting: Obstetrics and Gynecology

## 2023-02-01 DIAGNOSIS — R1319 Other dysphagia: Secondary | ICD-10-CM

## 2023-02-01 DIAGNOSIS — K2100 Gastro-esophageal reflux disease with esophagitis, without bleeding: Secondary | ICD-10-CM

## 2023-02-01 NOTE — Progress Notes (Signed)
Ref to GI for GERD, awaking at night with sx. Then feels like food caught in throat, no matter how much she drinks.

## 2023-02-07 NOTE — Progress Notes (Unsigned)
Celso Amy, PA-C [address]  [address]  [place], [place] [address]  Main: [phone]  Fax: [phone]   Gastroenterology Consultation  Referring Provider:     Trenda Moots Primary Care Physician:  Patient, No Pcp Per Primary Gastroenterologist:  *** Reason for Consultation:     GERD        HPI:   Jailin Manocchio is a 32 y.o. y/o female referred for consultation & management  by Patient, No Pcp Per.  ***  Past Medical History:  Diagnosis Date   Abortion    Miscarriage     Past Surgical History:  Procedure Laterality Date   HAND SURGERY Right     Prior to Admission medications   Medication Sig Start Date End Date Taking? Authorizing Provider  amoxicillin (AMOXIL) 875 MG tablet Take 1 tablet (875 mg total) by mouth 2 (two) times daily (morning and evening). Do all this for 10 days. 11/07/22     metroNIDAZOLE (FLAGYL) 500 MG tablet Take 1 tablet (500 mg total) by mouth 2 (two) times daily for 7 days. 07/28/22   Hildred Laser, MD  norgestimate-ethinyl estradiol (ORTHO-CYCLEN) 0.25-35 MG-MCG tablet Take 1 tablet by mouth daily. Patient desires continuous dosing please allow patient to pick up prescription early. Patient not taking: Reported on 07/27/2022 03/08/22   Glenna Fellows, FNP  cetirizine (ZYRTEC) 10 MG tablet Take 1 tablet (10 mg total) by mouth daily. Patient not taking: Reported on 12/22/2019 10/19/17 10/18/20  Zachery Dauer, NP  sodium chloride (OCEAN) 0.65 % SOLN nasal spray Place 1 spray into both nostrils as needed for congestion. Patient not taking: Reported on 12/22/2019 10/19/17 10/18/20  Zachery Dauer, NP    Family History  Problem Relation Age of Onset   Alcohol abuse Mother    Congestive Heart Failure Father    COPD Father    Cancer Paternal Aunt    Lupus Maternal Grandmother      Social History   Tobacco Use   Smoking status: Every Day    Types: E-cigarettes   Smokeless tobacco: Never   Tobacco comments:    vapes  Vaping Use    Vaping Use: Every day  Substance Use Topics   Alcohol use: Yes    Comment: weekends   Drug use: Not Currently    Types: Marijuana    Comment: occasional/social    Allergies as of 02/08/2023   (No Known Allergies)    Review of Systems:    All systems reviewed and negative except where noted in HPI.   Physical Exam:  There were no vitals taken for this visit. No LMP recorded. Psych:  Alert and cooperative. Normal mood and affect. General:   Alert,  Well-developed, well-nourished, pleasant and cooperative in NAD Head:  Normocephalic and atraumatic. Eyes:  Sclera clear, no icterus.   Conjunctiva pink. Neck:  Supple; no masses or thyromegaly. Lungs:  Respirations even and unlabored.  Clear throughout to auscultation.   No wheezes, crackles, or rhonchi. No acute distress. Heart:  Regular rate and rhythm; no murmurs, clicks, rubs, or gallops. Abdomen:  Normal bowel sounds.  No bruits.  Soft, and non-distended without masses, hepatosplenomegaly or hernias noted.  No Tenderness.  No guarding or rebound tenderness.    Neurologic:  Alert and oriented x3;  grossly normal neurologically. Psych:  Alert and cooperative. Normal mood and affect.  Imaging Studies: No results found.  Assessment and Plan:   Sevin Farone is a 32 y.o. y/o female has been referred  for ***  Follow up in ***  Celso Amy, PA-C    BP check ***

## 2023-02-08 ENCOUNTER — Ambulatory Visit (INDEPENDENT_AMBULATORY_CARE_PROVIDER_SITE_OTHER): Payer: 59 | Admitting: Physician Assistant

## 2023-02-08 ENCOUNTER — Other Ambulatory Visit: Payer: Self-pay

## 2023-02-08 ENCOUNTER — Encounter: Payer: Self-pay | Admitting: Physician Assistant

## 2023-02-08 VITALS — BP 133/86 | HR 90 | Temp 98.4°F | Ht 62.0 in | Wt 194.0 lb

## 2023-02-08 DIAGNOSIS — R196 Halitosis: Secondary | ICD-10-CM | POA: Diagnosis not present

## 2023-02-08 DIAGNOSIS — K219 Gastro-esophageal reflux disease without esophagitis: Secondary | ICD-10-CM | POA: Diagnosis not present

## 2023-02-08 MED ORDER — PANTOPRAZOLE SODIUM 40 MG PO TBEC
40.0000 mg | DELAYED_RELEASE_TABLET | Freq: Every day | ORAL | 1 refills | Status: DC
  Filled 2023-02-08: qty 90, 90d supply, fill #0
  Filled 2023-05-05 – 2023-07-30 (×2): qty 90, 90d supply, fill #1

## 2023-02-13 DIAGNOSIS — R196 Halitosis: Secondary | ICD-10-CM | POA: Diagnosis not present

## 2023-02-13 DIAGNOSIS — K219 Gastro-esophageal reflux disease without esophagitis: Secondary | ICD-10-CM | POA: Diagnosis not present

## 2023-02-15 LAB — H. PYLORI ANTIGEN, STOOL: H pylori Ag, Stl: NEGATIVE

## 2023-03-16 ENCOUNTER — Encounter: Payer: Self-pay | Admitting: Family Medicine

## 2023-03-16 ENCOUNTER — Ambulatory Visit (LOCAL_COMMUNITY_HEALTH_CENTER): Payer: 59 | Admitting: Family Medicine

## 2023-03-16 VITALS — BP 120/82 | HR 81 | Ht 63.0 in | Wt 191.6 lb

## 2023-03-16 DIAGNOSIS — Z01419 Encounter for gynecological examination (general) (routine) without abnormal findings: Secondary | ICD-10-CM | POA: Diagnosis not present

## 2023-03-16 DIAGNOSIS — Z113 Encounter for screening for infections with a predominantly sexual mode of transmission: Secondary | ICD-10-CM

## 2023-03-16 DIAGNOSIS — Z3009 Encounter for other general counseling and advice on contraception: Secondary | ICD-10-CM | POA: Diagnosis not present

## 2023-03-16 LAB — HM HIV SCREENING LAB: HM HIV Screening: NEGATIVE

## 2023-03-16 LAB — WET PREP FOR TRICH, YEAST, CLUE
Trichomonas Exam: NEGATIVE
Yeast Exam: NEGATIVE

## 2023-03-16 NOTE — Progress Notes (Signed)
Pt is here for family planning visit.  Family planning packet reviewed and given to pt.  Wet prep results reviewed, no treatment required per standing orders. Condoms declined. Gaspar Garbe, RN

## 2023-03-16 NOTE — Progress Notes (Signed)
[facility] DEPARTMENT [facility] [address] Main Number: [phone]  Family Planning Visit- Repeat Yearly Visit  Subjective:  Summer Powers is a 32 y.o. G6P0060  being seen today for an annual wellness visit and to discuss contraception options.   The patient is currently using No Method - Other Reason for pregnancy prevention. Patient does not want a pregnancy in the next year.    report they are looking for a method that provides Other does not want BCM   Patient has the following medical problems: has Hidradenitis suppurativa and Encounter for procreative genetic counseling on their problem list.  Chief Complaint  Patient presents with   Annual Exam    Patient reports to clinic for PE and STI testing. Declined BCM--counseled on LARCs.   See flowsheet for other program required questions.   Body mass index is 33.94 kg/m. - Patient is eligible for diabetes screening based on BMI> 25 and age >35?  yes HA1C ordered? yes  Patient reports 1 of partners in last year. Desires STI screening?  Yes   Has patient been screened once for HCV in the past?  No  No results found for: "HCVAB"  Does the patient have current of drug use, have a partner with drug use, and/or has been incarcerated since last result? No  If yes-- Screen for HCV through [facility]   Does the patient meet criteria for HBV testing? No  Criteria:  -Household, sexual or needle sharing contact with HBV -History of drug use -HIV positive -Those with known Hep C   Health Maintenance Due  Topic Date Due   COVID-19 Vaccine (1 - 2023-24 season) Never done   INFLUENZA VACCINE  03/15/2023    Review of Systems  Constitutional:  Negative for weight loss.  Eyes:  Negative for blurred vision.  Respiratory:  Negative for cough and shortness of breath.   Cardiovascular:  Negative for claudication.  Gastrointestinal:  Negative for nausea.  Genitourinary:  Negative for  dysuria and frequency.  Skin:  Negative for rash.  Neurological:  Negative for headaches.  Endo/Heme/Allergies:  Does not bruise/bleed easily.    The following portions of the patient's history were reviewed and updated as appropriate: allergies, current medications, past family history, past medical history, past social history, past surgical history and problem list. Problem list updated.  Objective:   Vitals:   03/16/23 0914  BP: 120/82  Pulse: 81  Weight: 191 lb 9.6 oz (86.9 kg)  Height: 5\' 3"  (1.6 m)    Physical Exam Vitals and nursing note reviewed.  Constitutional:      Appearance: Normal appearance.  HENT:     Head: Normocephalic and atraumatic.     Mouth/Throat:     Mouth: Mucous membranes are moist.     Pharynx: Oropharynx is clear. No oropharyngeal exudate or posterior oropharyngeal erythema.  Pulmonary:     Effort: Pulmonary effort is normal.  Abdominal:     General: Abdomen is flat.     Palpations: There is no mass.     Tenderness: There is no abdominal tenderness. There is no rebound.  Genitourinary:    General: Normal vulva.     Exam position: Lithotomy position.     Pubic Area: No rash or pubic lice.      Labia:        Right: No rash or lesion.        Left: No rash or lesion.      Vagina:  Normal. No vaginal discharge, erythema, bleeding or lesions.     Cervix: No cervical motion tenderness, discharge, friability, lesion or erythema.     Uterus: Normal.      Adnexa: Right adnexa normal and left adnexa normal.     Rectum: Normal.     Comments: pH = 4 Lymphadenopathy:     Head:     Right side of head: No preauricular or posterior auricular adenopathy.     Left side of head: No preauricular or posterior auricular adenopathy.     Cervical: No cervical adenopathy.     Upper Body:     Right upper body: No supraclavicular, axillary or epitrochlear adenopathy.     Left upper body: No supraclavicular, axillary or epitrochlear adenopathy.     Lower Body: No  right inguinal adenopathy. No left inguinal adenopathy.  Skin:    General: Skin is warm and dry.     Findings: No rash.  Neurological:     Mental Status: She is alert and oriented to person, place, and time.       Assessment and Plan:  Summer Powers is a 32 y.o. female G6P0060 presenting to the [facility] Department for an yearly wellness and contraception visit     1. Well woman exam with routine gynecological exam  - Hgb A1c w/o eAG  2. Family planning Contraception counseling: Reviewed options based on patient desire and reproductive life plan. Patient is interested in No Method - Other Reason. This was provided to the patient today.  Risks, benefits, and typical effectiveness rates were reviewed.  Questions were answered.  Written information was also given to the patient to review.    The patient will follow up in  1 years for surveillance.  The patient was told to call with any further questions, or with any concerns about this method of contraception.  Emphasized use of condoms 100% of the time for STI prevention.  Educated on ECP and assessed need for ECP. Not indicated- last sex 3 months ago  3. Screening for venereal disease  - Chlamydia/Gonorrhea [facility] - HIV [place] [facility] - Syphilis Serology, [facility] - WET PREP FOR TRICH, YEAST, CLUE    Return in about 1 year (around 03/15/2024) for annual well-woman exam.  No future appointments.  Summer Powers, [place]

## 2023-05-18 ENCOUNTER — Other Ambulatory Visit: Payer: Self-pay

## 2023-07-05 ENCOUNTER — Other Ambulatory Visit: Payer: Self-pay | Admitting: Obstetrics and Gynecology

## 2023-07-05 DIAGNOSIS — J358 Other chronic diseases of tonsils and adenoids: Secondary | ICD-10-CM

## 2023-07-05 NOTE — Progress Notes (Signed)
Pt with hx of GERD and halitosis. Dentist noticed tonsilloliths and recommended ENT ref to assess given sx.

## 2023-07-10 DIAGNOSIS — J3501 Chronic tonsillitis: Secondary | ICD-10-CM | POA: Diagnosis not present

## 2023-10-26 ENCOUNTER — Other Ambulatory Visit: Payer: Self-pay | Admitting: Unknown Physician Specialty

## 2023-10-31 ENCOUNTER — Encounter: Payer: Self-pay | Admitting: Unknown Physician Specialty

## 2023-10-31 DIAGNOSIS — J3501 Chronic tonsillitis: Secondary | ICD-10-CM | POA: Diagnosis not present

## 2023-11-01 ENCOUNTER — Other Ambulatory Visit: Payer: Self-pay

## 2023-11-19 NOTE — Discharge Instructions (Signed)
T & A INSTRUCTION SHEET - [facility]  CHAPMAN MCQUEEN, MD    INFORMATION SHEET FOR A TONSILLECTOMY AND ADENDOIDECTOMY  About Your Tonsils and Adenoids  The tonsils and adenoids are normal body tissues that are part of our immune system.  They normally help to protect us against diseases that may enter our mouth and nose. However, sometimes the tonsils and/or adenoids become too large and obstruct our breathing, especially at night.    If either of these things happen it helps to remove the tonsils and adenoids in order to become healthier. The operation to remove the tonsils and adenoids is called a tonsillectomy and adenoidectomy.  The Location of Your Tonsils and Adenoids  The tonsils are located in the back of the throat on both side and sit in a cradle of muscles. The adenoids are located in the roof of the mouth, behind the nose, and closely associated with the opening of the Eustachian tube to the ear.  Surgery on Tonsils and Adenoids  A tonsillectomy and adenoidectomy is a short operation which takes about thirty minutes.  This includes being put to sleep and being awakened. Tonsillectomies and adenoidectomies are performed at [facility] and may require observation period in the recovery room prior to going home. Children are required to remain in recovery for at least 45 minutes.   Following the Operation for a Tonsillectomy  A cautery machine is used to control bleeding. Bleeding from a tonsillectomy and adenoidectomy is minimal and postoperatively the risk of bleeding is approximately four percent, although this rarely life threatening.  After your tonsillectomy and adenoidectomy post-op care at home: 1. Our patients are able to go home the same day. You may be given prescriptions for pain medications, if indicated. 2. It is extremely important to remember that fluid intake is of utmost importance after a tonsillectomy. The  amount that you drink must be maintained in the postoperative period. A good indication of whether a child is getting enough fluid is whether his/her urine output is constant. As long as children are urinating or wetting their diaper every 6 - 8 hours this is usually enough fluid intake.   3. Although rare, this is a risk of some bleeding in the first ten days after surgery. This usually occurs between day five and nine postoperatively. This risk of bleeding is approximately four percent. If you or your child should have any bleeding you should remain calm and notify our office or go directly to the emergency room at [facility] where they will contact us. Our doctors are available seven days a week for notification. We recommend sitting up quietly in a chair, place an ice pack on the front of the neck and spitting out the blood gently until we are able to contact you. Adults should gargle gently with ice water and this may help stop the bleeding. If the bleeding does not stop after a short time, i.e. 10 to 15 minutes, or seems to be increasing again, please contact us or go to the hospital.   4. It is common for the pain to be worse at 5 - 7 days postoperatively. This occurs because the "scab" is peeling off and the mucous membrane (skin of the throat) is growing back where the tonsils were.   5. It is common for a low-grade fever, less than 102, during the first week after a tonsillectomy and adenoidectomy. It is usually due to   not drinking enough liquids, and we suggest your use liquid Tylenol (acetaminophen) or the pain medicine with Tylenol (acetaminophen) prescribed in order to keep your temperature below 102. Please follow the directions on the back of the bottle. 6. Recommendations for post-operative pain in children and adults: a) For Children 12 and younger: Recommendations are for oral Tylenol (acetaminophen) and oral Motrin (Ibuprofen). Administer the Tylenol (acetaminophen) and  Motrin (ibuprofen) as stated on bottle for patient's age/weight. Sometimes it may be necessary to alternate the Tylenol (acetaminophen) and Motrin for improved pain control. Motrin does last slightly longer so many patients benefit from being given this prior to bedtime. All children should avoid Aspirin products for 2 weeks following surgery. b) For children over the age of 12: Tylenol (acetaminophen) is the preferred first choice for pain control. Depending on your child's size, sometimes they will be given a combination of Tylenol (acetaminophen) and hydrocodone medication or sometimes it will be recommended they take Motrin (ibuprofen) in addition to the Tylenol (acetaminophen). Narcotics should always be used with caution in children following surgery as they can suppress their breathing and switching to over the counter Tylenol (acetaminophen) and Motrin (ibuprofen) as soon as possible is recommended. All patients should avoid Aspirin products for 2 weeks following surgery. c) Adults: Usually adults will require a narcotic pain medication following a tonsillectomy. This usually has either hydrocodone or oxycodone in it and can usually be taken every 4 to 6 hours as needed for moderate pain. If the medication does not have Tylenol (acetaminophen) in it, you may also supplement Tylenol (acetaminophen) as needed every 4 to 6 hours for breakthrough or mild pain. Adults should avoid Aspirin, Aleve, Motrin, and Ibuprofen products for 2 weeks following surgery as they can increase your risk of bleeding. 7. If you happen to look in the mirror or into your child's mouth you will see white/gray patches on the back of the throat. This is what a scab looks like in the mouth and is normal after having a tonsillectomy and adenoidectomy. They will disappear once the tonsil areas heal completely. However, it may cause a noticeable odor, and this too will disappear with time.     8. You or your child may experience ear  pain after having a tonsillectomy and adenoidectomy.  This is called referred pain and comes from the throat, but it is felt in the ears.  Ear pain is quite common and expected. It will usually go away after ten days. There is usually nothing wrong with the ears, and it is primarily due to the healing area stimulating the nerve to the ear that runs along the side of the throat. Use either the prescribed pain medicine or Tylenol (acetaminophen) as needed.  9. The throat tissues after a tonsillectomy are obviously sensitive. Smoking around children who have had a tonsillectomy significantly increases the risk of bleeding. DO NOT SMOKE!  

## 2023-11-23 ENCOUNTER — Other Ambulatory Visit: Payer: Self-pay

## 2023-11-23 ENCOUNTER — Encounter: Payer: Self-pay | Admitting: Unknown Physician Specialty

## 2023-11-23 ENCOUNTER — Ambulatory Visit: Payer: Self-pay | Admitting: Anesthesiology

## 2023-11-23 ENCOUNTER — Ambulatory Visit
Admission: RE | Admit: 2023-11-23 | Discharge: 2023-11-23 | Disposition: A | Discharge status: Home / Self Care | Attending: Unknown Physician Specialty | Admitting: Unknown Physician Specialty

## 2023-11-23 ENCOUNTER — Encounter
Admission: RE | Disposition: A | Discharge status: Home / Self Care | Payer: Self-pay | Source: Home / Self Care | Attending: Unknown Physician Specialty

## 2023-11-23 DIAGNOSIS — J3501 Chronic tonsillitis: Secondary | ICD-10-CM | POA: Insufficient documentation

## 2023-11-23 DIAGNOSIS — J3503 Chronic tonsillitis and adenoiditis: Secondary | ICD-10-CM | POA: Diagnosis not present

## 2023-11-23 DIAGNOSIS — K219 Gastro-esophageal reflux disease without esophagitis: Secondary | ICD-10-CM | POA: Insufficient documentation

## 2023-11-23 DIAGNOSIS — F17290 Nicotine dependence, other tobacco product, uncomplicated: Secondary | ICD-10-CM | POA: Diagnosis not present

## 2023-11-23 DIAGNOSIS — J351 Hypertrophy of tonsils: Secondary | ICD-10-CM | POA: Diagnosis not present

## 2023-11-23 HISTORY — DX: Presence of spectacles and contact lenses: Z97.3

## 2023-11-23 HISTORY — DX: Gastro-esophageal reflux disease without esophagitis: K21.9

## 2023-11-23 HISTORY — PX: TONSILLECTOMY AND ADENOIDECTOMY: SHX28

## 2023-11-23 LAB — POCT PREGNANCY, URINE: Preg Test, Ur: NEGATIVE

## 2023-11-23 SURGERY — TONSILLECTOMY AND ADENOIDECTOMY
Anesthesia: General | Site: Throat | Laterality: Bilateral

## 2023-11-23 MED ORDER — SCOPOLAMINE 1 MG/3DAYS TD PT72
1.0000 | MEDICATED_PATCH | Freq: Once | TRANSDERMAL | Status: DC
  Administered 2023-11-23: 1.5 mg via TRANSDERMAL

## 2023-11-23 MED ORDER — SUCCINYLCHOLINE CHLORIDE 200 MG/10ML IV SOSY
PREFILLED_SYRINGE | INTRAVENOUS | Status: DC | PRN
  Administered 2023-11-23: 120 mg via INTRAVENOUS

## 2023-11-23 MED ORDER — ACETAMINOPHEN 10 MG/ML IV SOLN
INTRAVENOUS | Status: AC
  Filled 2023-11-23: qty 100

## 2023-11-23 MED ORDER — GLYCOPYRROLATE 0.2 MG/ML IJ SOLN
INTRAMUSCULAR | Status: DC | PRN
  Administered 2023-11-23: .2 mg via INTRAVENOUS

## 2023-11-23 MED ORDER — HYDROCODONE-ACETAMINOPHEN 7.5-325 MG/15ML PO SOLN
10.0000 mL | Freq: Four times a day (QID) | ORAL | 0 refills | Status: DC | PRN
  Filled 2023-11-23: qty 450, 12d supply, fill #0

## 2023-11-23 MED ORDER — ACETAMINOPHEN 10 MG/ML IV SOLN: 1000.0000 mg | Freq: Once | INTRAVENOUS | Status: DC

## 2023-11-23 MED ORDER — PROPOFOL 10 MG/ML IV BOLUS
INTRAVENOUS | Status: DC | PRN
  Administered 2023-11-23: 50 ug/kg/min via INTRAVENOUS
  Administered 2023-11-23: 50 mg via INTRAVENOUS
  Administered 2023-11-23: 200 mg via INTRAVENOUS

## 2023-11-23 MED ORDER — MIDAZOLAM HCL 5 MG/5ML IJ SOLN
INTRAMUSCULAR | Status: DC | PRN
  Administered 2023-11-23: 2 mg via INTRAVENOUS

## 2023-11-23 MED ORDER — HYDROCODONE-ACETAMINOPHEN 7.5-325 MG/15ML PO SOLN: 10.0000 mL | Freq: Four times a day (QID) | ORAL | 0 refills | Status: DC | PRN

## 2023-11-23 MED ORDER — DEXAMETHASONE SODIUM PHOSPHATE 4 MG/ML IJ SOLN
INTRAMUSCULAR | Status: DC | PRN
  Administered 2023-11-23: 10 mg via INTRAVENOUS

## 2023-11-23 MED ORDER — FENTANYL CITRATE PF 50 MCG/ML IJ SOSY: 50.0000 ug | PREFILLED_SYRINGE | INTRAMUSCULAR | Status: DC | PRN

## 2023-11-23 MED ORDER — ONDANSETRON HCL 4 MG/2ML IJ SOLN
INTRAMUSCULAR | Status: DC | PRN
  Administered 2023-11-23: 4 mg via INTRAVENOUS

## 2023-11-23 MED ORDER — DEXMEDETOMIDINE HCL IN NACL 80 MCG/20ML IV SOLN
INTRAVENOUS | Status: AC
  Filled 2023-11-23: qty 20

## 2023-11-23 MED ORDER — LIDOCAINE HCL (CARDIAC) PF 100 MG/5ML IV SOSY
PREFILLED_SYRINGE | INTRAVENOUS | Status: DC | PRN
  Administered 2023-11-23: 100 mg via INTRAVENOUS

## 2023-11-23 MED ORDER — SODIUM CHLORIDE 0.9 % IV SOLN
INTRAVENOUS | Status: DC | PRN
Start: 2023-11-23 — End: 2023-11-23

## 2023-11-23 MED ORDER — LIDOCAINE VISCOUS HCL 2 % MT SOLN
10.0000 mL | OROMUCOSAL | 5 refills | Status: DC | PRN
  Filled 2023-11-23: qty 100, 2d supply, fill #0

## 2023-11-23 MED ORDER — OXYCODONE HCL 5 MG/5ML PO SOLN
ORAL | Status: AC
Start: 2023-11-23 — End: ?
  Filled 2023-11-23: qty 10

## 2023-11-23 MED ORDER — BUPIVACAINE HCL (PF) 0.5 % IJ SOLN
INTRAMUSCULAR | Status: DC | PRN
  Administered 2023-11-23: 8 mL

## 2023-11-23 MED ORDER — MIDAZOLAM HCL 2 MG/2ML IJ SOLN
INTRAMUSCULAR | Status: AC
Start: 2023-11-23 — End: ?
  Filled 2023-11-23: qty 2

## 2023-11-23 MED ORDER — GLYCOPYRROLATE 0.2 MG/ML IJ SOLN
INTRAMUSCULAR | Status: AC
  Filled 2023-11-23: qty 1

## 2023-11-23 MED ORDER — FENTANYL CITRATE (PF) 100 MCG/2ML IJ SOLN
INTRAMUSCULAR | Status: AC
  Filled 2023-11-23: qty 2

## 2023-11-23 MED ORDER — FENTANYL CITRATE (PF) 100 MCG/2ML IJ SOLN
INTRAMUSCULAR | Status: DC | PRN
  Administered 2023-11-23: 100 ug via INTRAVENOUS

## 2023-11-23 MED ORDER — LACTATED RINGERS IV SOLN: INTRAVENOUS | Status: DC

## 2023-11-23 MED ORDER — ONDANSETRON HCL 4 MG/2ML IJ SOLN
INTRAMUSCULAR | Status: AC
  Filled 2023-11-23: qty 2

## 2023-11-23 MED ORDER — LIDOCAINE VISCOUS HCL 2 % MT SOLN: 10.0000 mL | OROMUCOSAL | 5 refills | Status: DC | PRN

## 2023-11-23 MED ORDER — ONDANSETRON HCL 4 MG/2ML IJ SOLN
4.0000 mg | Freq: Once | INTRAMUSCULAR | Status: AC
  Administered 2023-11-23: 4 mg via INTRAVENOUS

## 2023-11-23 MED ORDER — DEXMEDETOMIDINE HCL IN NACL 200 MCG/50ML IV SOLN
INTRAVENOUS | Status: DC | PRN
Start: 2023-11-23 — End: 2023-11-23
  Administered 2023-11-23 (×2): 10 ug via INTRAVENOUS

## 2023-11-23 MED ORDER — SCOPOLAMINE 1 MG/3DAYS TD PT72
MEDICATED_PATCH | TRANSDERMAL | Status: AC
  Filled 2023-11-23: qty 1

## 2023-11-23 MED ORDER — ACETAMINOPHEN 10 MG/ML IV SOLN
1000.0000 mg | Freq: Once | INTRAVENOUS | Status: AC
Start: 2023-11-23 — End: 2023-11-23
  Administered 2023-11-23: 1000 mg via INTRAVENOUS

## 2023-11-23 MED ORDER — OXYCODONE HCL 5 MG/5ML PO SOLN
10.0000 mg | Freq: Once | ORAL | Status: AC
  Administered 2023-11-23: 10 mg via ORAL

## 2023-11-23 MED ORDER — DEXAMETHASONE SODIUM PHOSPHATE 4 MG/ML IJ SOLN
INTRAMUSCULAR | Status: AC
Start: 2023-11-23 — End: ?
  Filled 2023-11-23: qty 3

## 2023-11-23 SURGICAL SUPPLY — 19 items
"PENCIL ELECTRO HAND CTR " (MISCELLANEOUS) ×1 IMPLANT
ANTIFOG SOL W/FOAM PAD STRL (MISCELLANEOUS) ×1 IMPLANT
CANISTER SUCT 1200ML W/VALVE (MISCELLANEOUS) ×1 IMPLANT
CATH ROBINSON RED A/P 8FR (CATHETERS) ×1 IMPLANT
COAGULATOR SUCTION 6 10FR HC (MISCELLANEOUS) ×1 IMPLANT
DRAPE HEAD BAR (DRAPES) ×1 IMPLANT
ELECT CAUTERY BLADE TIP 2.5 (TIP) ×1 IMPLANT
ELECT REM PT RETURN 9FT ADLT (ELECTROSURGICAL) ×1 IMPLANT
ELECTRODE CAUTERY BLDE TIP 2.5 (TIP) ×1 IMPLANT
ELECTRODE REM PT RTRN 9FT ADLT (ELECTROSURGICAL) ×1 IMPLANT
GLOVE BIO SURGEON STRL SZ7.5 (GLOVE) ×1 IMPLANT
KIT TURNOVER KIT A (KITS) ×1 IMPLANT
NS IRRIG 500ML POUR BTL (IV SOLUTION) ×1 IMPLANT
PACK TONSIL AND ADENOID CUSTOM (PACKS) ×1 IMPLANT
PENCIL ELECTRO HAND CTR (MISCELLANEOUS) ×1 IMPLANT
SOLUTION ANTFG W/FOAM PAD STRL (MISCELLANEOUS) ×1 IMPLANT
SPONGE TONSIL 1 RF SGL (DISPOSABLE) ×1 IMPLANT
STRAP BODY AND KNEE 60X3 (MISCELLANEOUS) ×1 IMPLANT
SYR 10ML LL (SYRINGE) ×1 IMPLANT

## 2023-11-23 NOTE — Anesthesia Procedure Notes (Signed)
 Procedure Name: Intubation Date/Time: 11/23/2023 12:45 PM  Performed by: Domenic Moras, CRNAPre-anesthesia Checklist: Patient identified, Emergency Drugs available, Suction available and Patient being monitored Patient Re-evaluated:Patient Re-evaluated prior to induction Oxygen Delivery Method: Circle system utilized Preoxygenation: Pre-oxygenation with 100% oxygen Induction Type: IV induction Ventilation: Mask ventilation without difficulty Laryngoscope Size: Mac and 3 Grade View: Grade I Tube type: Oral Rae Tube size: 7.0 mm Number of attempts: 1 Airway Equipment and Method: Stylet and Oral airway Placement Confirmation: ETT inserted through vocal cords under direct vision, positive ETCO2 and breath sounds checked- equal and bilateral Tube secured with: Tape Dental Injury: Teeth and Oropharynx as per pre-operative assessment

## 2023-11-23 NOTE — H&P (Signed)
 The patient's history has been reviewed, patient examined, no change in status, stable for surgery.  Questions were answered to the patients satisfaction.

## 2023-11-23 NOTE — Op Note (Signed)
 PREOPERATIVE DIAGNOSIS:  CHRONIC TONSILITIS  POSTOPERATIVE DIAGNOSIS:  Chronic Tonsillitis  OPERATION:  Tonsillectomy.  SURGEON:  Davina Poke, MD  ANESTHESIA:  General endotracheal.  OPERATIVE FINDINGS:  Large tonsils.  No significant adenoid tissue  DESCRIPTION OF THE PROCEDURE: Summer Powers was identified in the holding area and taken to the operating room and placed in the supine position.  After general endotracheal anesthesia, the table was turned 45 degrees and the patient was draped in the usual fashion for a tonsillectomy.  A mouth gag was inserted into the oral cavity.  There were large tonsils.  A red rubber catheter was placed through the nostril examination nasopharynx showed no significant adenoid tissue therefore adenoidectomy was not performed.  The operation then turned to the tonsillectomy.  Beginning on the left-hand side a tenaculum was used to grasp the tonsil and the Bovie cautery was used to dissect it free from the fossa.  In a similar fashion, the right tonsil was removed.  Meticulous hemostasis was achieved using the Bovie cautery.  With both tonsils removed and no active bleeding, 0.5% plain Marcaine was used to inject the anterior and posterior tonsillar pillars bilaterally.  A total of 8 mL was used.  The patient tolerated the procedure well and was awakened in the operating room and taken to the recovery room in stable condition.   CULTURES:  None.  SPECIMENS:  Tonsils.  ESTIMATED BLOOD LOSS:  Less than 10 ml.  Davina Poke  11/23/2023  1:08 PM

## 2023-11-23 NOTE — Transfer of Care (Signed)
 Immediate Anesthesia Transfer of Care Note  Patient: Summer Powers  Procedure(s) Performed: TONSILLECTOMY (Bilateral: Throat)  Patient Location: PACU  Anesthesia Type: General ETT  Level of Consciousness: awake, alert  and patient cooperative  Airway and Oxygen Therapy: Patient Spontanous Breathing and Patient connected to supplemental oxygen  Post-op Assessment: Post-op Vital signs reviewed, Patient's Cardiovascular Status Stable, Respiratory Function Stable, Patent Airway and No signs of Nausea or vomiting  Post-op Vital Signs: Reviewed and stable  Complications: No notable events documented.

## 2023-11-23 NOTE — Anesthesia Postprocedure Evaluation (Signed)
 Anesthesia Post Note  Patient: Summer Powers  Procedure(s) Performed: TONSILLECTOMY (Bilateral: Throat)  Patient location during evaluation: PACU Anesthesia Type: General Level of consciousness: awake and alert Pain management: pain level controlled Vital Signs Assessment: post-procedure vital signs reviewed and stable Respiratory status: spontaneous breathing, nonlabored ventilation, respiratory function stable and patient connected to nasal cannula oxygen Cardiovascular status: blood pressure returned to baseline and stable Postop Assessment: no apparent nausea or vomiting Anesthetic complications: no   No notable events documented.   Last Vitals:  Vitals:   11/23/23 1320 11/23/23 1345  BP: 111/76 125/73  Pulse: 93 95  Resp: 14 18  Temp: (!) 36.1 C (!) 36.1 C  SpO2: 93% 96%    Last Pain:  Vitals:   11/23/23 1345  PainSc: 5                  Randol Zumstein C Alecsander Hattabaugh

## 2023-11-23 NOTE — Anesthesia Preprocedure Evaluation (Addendum)
 Anesthesia Evaluation  Patient identified by MRN, date of birth, ID band Patient awake    Reviewed: Allergy & Precautions, H&P , NPO status , Patient's Chart, lab work & pertinent test results  Airway Mallampati: III  TM Distance: >3 FB Neck ROM: Full    Dental no notable dental hx.    Pulmonary neg pulmonary ROS, former smoker   Pulmonary exam normal breath sounds clear to auscultation       Cardiovascular negative cardio ROS Normal cardiovascular exam Rhythm:Regular Rate:Normal     Neuro/Psych negative neurological ROS  negative psych ROS   GI/Hepatic negative GI ROS, Neg liver ROS,GERD  ,,  Endo/Other  negative endocrine ROS    Renal/GU negative Renal ROS  negative genitourinary   Musculoskeletal negative musculoskeletal ROS (+)    Abdominal   Peds negative pediatric ROS (+)  Hematology negative hematology ROS (+)   Anesthesia Other Findings GERD Wears contact lens   Reproductive/Obstetrics negative OB ROS                              Anesthesia Physical Anesthesia Plan  ASA: 1  Anesthesia Plan: General ETT   Post-op Pain Management:    Induction: Intravenous  PONV Risk Score and Plan:   Airway Management Planned: Oral ETT  Additional Equipment:   Intra-op Plan:   Post-operative Plan: Extubation in OR  Informed Consent: I have reviewed the patients History and Physical, chart, labs and discussed the procedure including the risks, benefits and alternatives for the proposed anesthesia with the patient or authorized representative who has indicated his/her understanding and acceptance.     Dental Advisory Given  Plan Discussed with: Anesthesiologist, CRNA and Surgeon  Anesthesia Plan Comments: (Patient consented for risks of anesthesia including but not limited to:  - adverse reactions to medications - damage to eyes, teeth, lips or other oral mucosa - nerve  damage due to positioning  - sore throat or hoarseness - Damage to heart, brain, nerves, lungs, other parts of body or loss of life  Patient voiced understanding and assent.)         Anesthesia Quick Evaluation

## 2023-11-26 LAB — SURGICAL PATHOLOGY

## 2024-01-16 ENCOUNTER — Ambulatory Visit: Admitting: Dietician

## 2024-03-17 ENCOUNTER — Ambulatory Visit: Payer: Self-pay

## 2024-03-26 ENCOUNTER — Encounter: Payer: Self-pay | Admitting: Family Medicine

## 2024-03-26 ENCOUNTER — Ambulatory Visit: Payer: Self-pay | Admitting: Family Medicine

## 2024-03-26 VITALS — BP 127/84 | HR 101 | Ht 63.0 in | Wt 204.4 lb

## 2024-03-26 DIAGNOSIS — Z711 Person with feared health complaint in whom no diagnosis is made: Secondary | ICD-10-CM | POA: Insufficient documentation

## 2024-03-26 DIAGNOSIS — Z113 Encounter for screening for infections with a predominantly sexual mode of transmission: Secondary | ICD-10-CM | POA: Insufficient documentation

## 2024-03-26 DIAGNOSIS — Z3009 Encounter for other general counseling and advice on contraception: Secondary | ICD-10-CM | POA: Diagnosis not present

## 2024-03-26 LAB — WET PREP FOR TRICH, YEAST, CLUE
Clue Cell Exam: NEGATIVE
Trichomonas Exam: NEGATIVE
Yeast Exam: NEGATIVE

## 2024-03-26 LAB — HM HIV SCREENING LAB: HM HIV Screening: NEGATIVE

## 2024-03-26 NOTE — Patient Instructions (Signed)
 PAP Smear Your next PAP smear is due July of 2027.  Your last one on 03/01/2021 had normal cells and a negative HPV test. This puts you on a 5 year cycle with current recommendations.   STI screening - Today we obtained a vaginal swab to screen for gonorrhea, chlamydia, and trichomonas - We also obtained a blood sample to screen for HIV and syphilis - If the results are abnormal, I will give you a call.    Estimated time frame for results collected at the [facility] Department: Same day Trichomonas Yeast BV (bacterial vaginosis)  Within 1-2 weeks Gonorrhea Chlamydia  Within 1-2 weeks HIV Syphilis Hepatitis B Hepatitis C

## 2024-03-26 NOTE — Progress Notes (Signed)
 [facility] DEPARTMENT [facility] [address] [place] [address] Main phone: [phone]  Family Planning Visit - Repeat Yearly Visit  Subjective:  Summer Powers is a 33 y.o. G6P0060  being seen today for an annual wellness visit and to discuss contraception options. The patient is currently using no method - no contraceptive precautions for pregnancy prevention. Patient does not want a pregnancy in the next year.   Patient has the following medical problems:  Patient Active Problem List   Diagnosis Date Noted   Concern about skin disease without diagnosis 03/26/2024   Screening examination for venereal disease 03/26/2024   Hidradenitis suppurativa 09/13/2018   Chief Complaint  Patient presents with   Annual Exam   HPI Patient reports she would like her annual physical and STI testing.   Review of Systems  Constitutional:  Negative for fever, malaise/fatigue and weight loss.  Respiratory:  Negative for shortness of breath.   Cardiovascular:  Negative for chest pain and palpitations.   See flowsheet for further details and programmatic requirements Hyperlink available at the top of the signed note in blue.  Flow sheet content below:  Pregnancy Intention Screening Does the patient want to become pregnant in the next year?: No Does the patient's partner want to become pregnant in the next year?: No Would the patient like to discuss contraceptive options today?: No Other:  Password: charlie Is it okay to contact you by mail?: Yes Difficulty accessing hygiene products (feminine products/soap) in the last 3 months: No Contraception History Past methods of contraception used by patient:: Hormonal Injection Adverse effects associated with Hormonal Injection: none Sexual History What age did you start your period?: 11 How often do you have your period?: monthly Date of last sex?: 02/12/24 Has the patient had unprotected sex  within the last 5 days?: No Do you have sex with men, women, both men and women?: Men only In the past 2 months how many partners have you had sex with?: 1 In the past 12 months, how many partners have you had sex with?: 1 Is it possible that any of your sex partners in the past 12 months had sex with someone else whild they were still in a sexual relationship with you?: Yes What ways do you have sex?: Vaginal Do you or your partner use condoms and/or dental dams every time you have vaginal, oral or anal sex?: No Do you douche?: No Date of last HIV test?: 03/16/23 Have you ever had an STD?: Yes Have any of your partners had an STD?: Yes Partner Previous STD?: Chlamydia Have you or your partner ever shot up drugs?: No Have any of your partners used drugs in the past?: No Have you or your partners exchanged money or drugs for sex?: No Risk Factors for Hep B Household, sexual, or needle sharing contact of a person infected with Hep B: No Sexual contact with a person who uses drugs not as prescribed?: No Currently or Ever used drugs not as prescribed: No HIV Positive: No PRep Patient: No Men who have sex with men: N/A Have Hepatitis C: No History of Incarceration: No History of Homeslessness?: No Anal sex following anal drug use?: N/A Risk Factors for Hep C Currently using drugs not as prescribed: No Sexual partner(s) currently using drugs as not prescribed: No History of drug use: No HIV Positive: No People with a history of incarceration: No People born between the years of 61 and 49: No Counseling Education: Make  informed decision about family planning, Provided preconception counseling, Reduce risk of transmission and protection from STD's and HIV, Understand BMI >25 or >18.5 is a health risk (weight management educational materials to be provided to client requests), Promoted daily consumption of MVI with folic acid if capable of conceiving., Review immunization history, inform  client of recommended vaccines per CDC's ACIP Guidelines and refer to Immunization clinic, How to discontinue the method selected and information on back up method used, Results of physical assessment and labs (if performed), How to use the method selected and information on back up method used, How to use the method consistently and correctly, Teach back method completed, Warning signs for rare but serious adverse events and what to do if they experience a warning sign (including emergency 24 hour number, where to seek emergency service outside of hours of operation), When to return for follow up (planned return schedule), PCP list given to patient Contraception Wrap Up Current Method: No Contraceptive Precautions End Method: No Contraception Precautions Contraception Counseling Provided: Yes How was the end contraceptive method provided?: Provided on site  Diabetes screening This patient is 33 y.o. with a BMI of Body mass index is 36.21 kg/m.[name]  Is patient eligible for diabetes screening (age >35 and BMI >25)?  not applicable  Was Hgb A1c ordered? not applicable  STI screening Patient reports 1 of partners in last year.  Does this patient desire STI screening?  Yes  Hepatitis C screening Has patient been screened once for HCV in the past?  No  No results found for: HCVAB  Does the patient meet criteria for HCV testing? No   Hepatitis B screening Does the patient meet criteria for HBV testing? No  Cervical Cancer Screening  Result Date Procedure Results Follow-ups  03/01/2021 IGP, Aptima HPV DIAGNOSIS:: Comment Specimen adequacy:: Comment Clinician Provided ICD10: Comment Performed by:: Comment QC reviewed by:: Comment PAP Smear Comment: . Note:: Comment Test Methodology: Comment HPV Aptima: Negative   08/02/2016 HM PAP SMEAR HM Pap smear: Negative   08/16/2015 HM PAP SMEAR HM Pap smear: normal    Health Maintenance Due  Topic Date Due   Hepatitis B Vaccines (1 of 3 - 19+  3-dose series) Never done   COVID-19 Vaccine (1 - 2024-25 season) Never done   INFLUENZA VACCINE  03/14/2024   The following portions of the patient's history were reviewed and updated as appropriate: allergies, current medications, past family history, past medical history, past social history, past surgical history and problem list. Problem list updated.  Objective:   Vitals:   03/26/24 1458  BP: 127/84  Pulse: (!) 101  Weight: 204 lb 6.4 oz (92.7 kg)  Height: 5' 3 (1.6 m)   Physical Exam Vitals and nursing note reviewed. Exam conducted with a chaperone present Brett Orange CNA, present for breast exam).  Constitutional:      Appearance: Normal appearance.  HENT:     Head: Normocephalic.     Mouth/Throat:     Mouth: Mucous membranes are moist.     Pharynx: No oropharyngeal exudate or posterior oropharyngeal erythema.  Eyes:     General: No scleral icterus.       Right eye: No discharge.        Left eye: No discharge.  Cardiovascular:     Rate and Rhythm: Regular rhythm.     Heart sounds: Normal heart sounds.  Pulmonary:     Effort: Pulmonary effort is normal.     Breath sounds: Normal breath sounds.  Chest:     Chest wall: No mass, lacerations, deformity, swelling or tenderness.  Breasts:    Tanner Score is 5.     Breasts are symmetrical.     Right: Normal. No swelling, bleeding, mass or tenderness.     Left: Normal. No swelling, bleeding, mass or tenderness.  Abdominal:     General: Bowel sounds are normal. There is no distension.     Palpations: Abdomen is soft.     Tenderness: There is no abdominal tenderness. There is no rebound.     Hernia: No hernia is present.  Genitourinary:    Comments: Declined genital exam-self swabbed Musculoskeletal:        General: Normal range of motion.     Cervical back: Neck supple. No rigidity or tenderness.  Lymphadenopathy:     Head:     Right side of head: No submandibular, preauricular or posterior auricular adenopathy.      Left side of head: No submandibular, preauricular or posterior auricular adenopathy.     Cervical: No cervical adenopathy.     Right cervical: No superficial or posterior cervical adenopathy.    Left cervical: No superficial or posterior cervical adenopathy.     Upper Body:     Right upper body: No supraclavicular, axillary or pectoral adenopathy.     Left upper body: No supraclavicular, axillary or pectoral adenopathy.  Skin:    General: Skin is warm and dry.     Coloration: Skin is not jaundiced or pale.     Findings: No bruising, erythema, lesion or rash.     Comments: Scattered hyperpigmented macules over nose without erythema, drainage, or fluctuance.  Neurological:     General: No focal deficit present.     Mental Status: She is alert and oriented to person, place, and time.  Psychiatric:        Mood and Affect: Mood normal.        Behavior: Behavior normal.    Assessment and Plan:  Summer Powers is a 33 y.o. female G6P0060 presenting to the [facility] Department for an yearly wellness and contraception visit  Contraception counseling:  Reviewed options based on patient desire and reproductive life plan. Patient is interested in No Method - No Contraceptive Precautions.   Risks, benefits, and typical effectiveness rates were reviewed.  Questions were answered.  Written information was also given to the patient to review.    The patient will follow up in  1 years for surveillance.  The patient was told to call with any further questions, or with any concerns about this method of contraception.  Emphasized use of condoms 100% of the time for STI prevention.  Emergency Contraception Precautions (ECP): Patient assessed for need of ECP. She is not a candidate based on report of unprotected sex more than 120 hours ago (5 days).   Screening examination for venereal disease -     Gonococcus culture -     WET PREP FOR TRICH, YEAST, CLUE -     Chlamydia/Gonorrhea [place]  [facility] -     Syphilis Serology, [place] [facility] -     HIV [place] [facility]  Concern about skin disease without diagnosis -     Ambulatory referral to Dermatology   No follow-ups on file.  Future Appointments  Date Time Provider Department Center  04/10/2024  3:15 PM Claudene Lehmann, MD [facility] None  05/07/2024 10:20 AM Simmons-Robinson, Rockie, MD [facility]   Betsey CHRISTELLA Helling, MD

## 2024-03-26 NOTE — Progress Notes (Signed)
 Pt is here for family planning visit.  Family planning education card reviewed and given to pt.  Wet prep results reviewed, no treatment required per standing orders.Larraine JONELLE Northern, RN

## 2024-03-31 NOTE — Addendum Note (Signed)
 Addended by: Graciemae Delisle M on: 03/31/2024 04:04 PM   Modules accepted: Level of Service

## 2024-04-01 LAB — GONOCOCCUS CULTURE

## 2024-04-02 ENCOUNTER — Encounter: Payer: Self-pay | Admitting: Family Medicine

## 2024-04-10 ENCOUNTER — Other Ambulatory Visit: Payer: Self-pay

## 2024-04-10 ENCOUNTER — Ambulatory Visit (INDEPENDENT_AMBULATORY_CARE_PROVIDER_SITE_OTHER): Admitting: Dermatology

## 2024-04-10 ENCOUNTER — Encounter: Payer: Self-pay | Admitting: Dermatology

## 2024-04-10 DIAGNOSIS — L906 Striae atrophicae: Secondary | ICD-10-CM

## 2024-04-10 DIAGNOSIS — L219 Seborrheic dermatitis, unspecified: Secondary | ICD-10-CM | POA: Diagnosis not present

## 2024-04-10 DIAGNOSIS — L814 Other melanin hyperpigmentation: Secondary | ICD-10-CM

## 2024-04-10 DIAGNOSIS — L732 Hidradenitis suppurativa: Secondary | ICD-10-CM | POA: Diagnosis not present

## 2024-04-10 MED ORDER — KETOCONAZOLE 2 % EX SHAM
1.0000 | MEDICATED_SHAMPOO | CUTANEOUS | 3 refills | Status: AC
  Filled 2024-04-10: qty 120, 30d supply, fill #0
  Filled 2024-07-18: qty 120, 30d supply, fill #1

## 2024-04-10 MED ORDER — FLUOCINONIDE 0.05 % EX SOLN
1.0000 | CUTANEOUS | 3 refills | Status: AC
  Filled 2024-04-10: qty 60, 30d supply, fill #0
  Filled 2024-07-18: qty 60, 30d supply, fill #1

## 2024-04-10 NOTE — Patient Instructions (Addendum)
 Dark spots on nose INKY Tranexamic Acid serum daily Can Start over the counter Adapalene 0.1% gel to nose Continue sunscreen daily  Topical retinoid medications like tretinoin/Retin-A, adapalene/Differin, tazarotene/Fabior, and Epiduo/Epiduo Forte can cause dryness and irritation when first started. Only apply a pea-sized amount to the entire affected area. Avoid applying it around the eyes, edges of mouth and creases at the nose. If you experience irritation, use a good moisturizer first and/or apply the medicine less often. If you are doing well with the medicine, you can increase how often you use it until you are applying every night. Be careful with sun protection while using this medication as it can make you sensitive to the sun. This medicine should not be used by pregnant women.    For Dandruff and itchy scalp Start Ketoconazole  2% shampoo 2 times weekly, massage into scalp let sit 5 minutes and rinse out Start Fluocinonide  solution one to two times daily to itchy scalp as needed for flares  For Hidradenitis inner thighs Start over the counter Benzole Peroxide wash daily, can bleach towels Continue Hibiclens wash once daily    Due to recent changes in healthcare laws, you may see results of your pathology and/or laboratory studies on MyChart before the doctors have had a chance to review them. We understand that in some cases there may be results that are confusing or concerning to you. Please understand that not all results are received at the same time and often the doctors may need to interpret multiple results in order to provide you with the best plan of care or course of treatment. Therefore, we ask that you please give us  2 business days to thoroughly review all your results before contacting the office for clarification. Should we see a critical lab result, you will be contacted sooner.   If You Need Anything After Your Visit  If you have any questions or concerns for your  doctor, please call our main line at [phone] and press option 4 to reach your doctor's medical assistant. If no one answers, please leave a voicemail as directed and we will return your call as soon as possible. Messages left after 4 pm will be answered the following business day.   You may also send us  a message via MyChart. We typically respond to MyChart messages within 1-2 business days.  For prescription refills, please ask your pharmacy to contact our office. Our fax number is [phone].  If you have an urgent issue when the clinic is closed that cannot wait until the next business day, you can page your doctor at the number below.    Please note that while we do our best to be available for urgent issues outside of office hours, we are not available 24/7.   If you have an urgent issue and are unable to reach us , you may choose to seek medical care at your doctor's office, retail clinic, urgent care center, or emergency room.  If you have a medical emergency, please immediately call 911 or go to the emergency department.  Pager Numbers  - Dr. Hester: [phone]  - Dr. Jackquline: [phone]  - Dr. Claudene: [phone]   - Dr. Raymund: [phone]  In the event of inclement weather, please call our main line at [phone] for an update on the status of any delays or closures.  Dermatology Medication Tips: Please keep the boxes that topical medications come in in order to help keep track of the instructions about where and how to  use these. Pharmacies typically print the medication instructions only on the boxes and not directly on the medication tubes.   If your medication is too expensive, please contact our office at [phone] option 4 or send us  a message through MyChart.   We are unable to tell what your co-pay for medications will be in advance as this is different depending on your insurance coverage. However, we may be able to find a substitute medication at  lower cost or fill out paperwork to get insurance to cover a needed medication.   If a prior authorization is required to get your medication covered by your insurance company, please allow us  1-2 business days to complete this process.  Drug prices often vary depending on where the prescription is filled and some pharmacies may offer cheaper prices.  The website www.goodrx.com contains coupons for medications through different pharmacies. The prices here do not account for what the cost may be with help from insurance (it may be cheaper with your insurance), but the website can give you the price if you did not use any insurance.  - You can print the associated coupon and take it with your prescription to the pharmacy.  - You may also stop by our office during regular business hours and pick up a GoodRx coupon card.  - If you need your prescription sent electronically to a different pharmacy, notify our office through [facility] or by phone at [phone] option 4.     Si Usted Necesita Algo Despus de Su Visita  Tambin puede enviarnos un mensaje a travs de Clinical cytogeneticist. Por lo general respondemos a los mensajes de MyChart en el transcurso de 1 a 2 das hbiles.  Para renovar recetas, por favor pida a su farmacia que se ponga en contacto con nuestra oficina. Randi lakes de fax es [place] [phone].  Si tiene un asunto urgente cuando la clnica est cerrada y que no puede esperar hasta el siguiente da hbil, puede llamar/localizar a su doctor(a) al nmero que aparece a continuacin.   Por favor, tenga en cuenta que aunque hacemos todo lo posible para estar disponibles para asuntos urgentes fuera del horario de [place], no estamos disponibles las 24 horas del da, los 7 [address]  [address] de la [place].   Si tiene un problema urgente y no puede comunicarse con nosotros, puede optar por buscar atencin mdica  en el consultorio de su doctor(a), en una clnica privada, en un centro de atencin urgente  o en una sala de emergencias.  Si tiene Engineer, drilling, por favor llame inmediatamente al 911 o vaya a la sala de emergencias.  Nmeros de bper  - Dr. Hester: [phone]  - Dra. Jackquline: [phone]  - Dr. Claudene: [phone]  - Dra. Kitts: [phone]  En caso de inclemencias del [place], por favor llame a nuestra lnea principal al [phone] para una actualizacin sobre el estado de cualquier retraso o cierre.  Consejos para la medicacin en dermatologa: Por favor, guarde las cajas en las que vienen los medicamentos de uso tpico para ayudarle a seguir las instrucciones sobre dnde y cmo usarlos. Las farmacias generalmente imprimen las instrucciones del medicamento slo en las cajas y no directamente en los tubos del [place].   Si su medicamento es muy caro, por favor, pngase en contacto con landry rieger llamando al [phone] y presione la opcin 4 o envenos un mensaje a travs de Clinical cytogeneticist.   No podemos decirle cul ser su copago por los medicamentos por adelantado ya que  esto es diferente dependiendo de la cobertura de su seguro. Sin embargo, es posible que podamos encontrar un medicamento sustituto a Audiological scientist un formulario para que el seguro cubra el medicamento que se considera necesario.   Si se requiere una autorizacin previa para que su compaa de seguros malta su medicamento, por favor permtanos de 1 a 2 das hbiles para completar este proceso.  Los precios de los medicamentos varan con frecuencia dependiendo del Environmental consultant de dnde se surte la receta y alguna farmacias pueden ofrecer precios ms baratos.  El sitio web www.goodrx.com tiene cupones para medicamentos de Health and safety inspector. Los precios aqu no tienen en cuenta lo que podra costar con la ayuda del seguro (puede ser ms barato con su seguro), pero el sitio web puede darle el precio si no utiliz Tourist information centre manager.  - Puede imprimir el cupn correspondiente y llevarlo con su  receta a la farmacia.  - Tambin puede pasar por nuestra oficina durante el horario de atencin regular y Education officer, museum una tarjeta de cupones de GoodRx.  - Si necesita que su receta se enve electrnicamente a una farmacia diferente, informe a nuestra oficina a travs de MyChart de [place] o por telfono llamando al [phone] y presione la opcin 4.

## 2024-04-10 NOTE — Progress Notes (Signed)
 New Patient Visit   Subjective  Summer Powers is a 33 y.o. female who presents for the following: dark spots nose, ~61yr, started Cerave SPF 30, no hx of acne of bumps where dark spots are, no hx of skin cancer, no fhx of skin cancer, hx of HS inner thigh, Hibiclens soap, witch hazel  New patient referral from Dr. Dorothyann Helling.  The following portions of the chart were reviewed this encounter and updated as appropriate: medications, allergies, medical history  Review of Systems:  No other skin or systemic complaints except as noted in HPI or Assessment and Plan.  Objective  Well appearing patient in no apparent distress; mood and affect are within normal limits.   A focused examination was performed of the following areas: Face, scalp, arms, legs, back, axilla  Relevant exam findings are noted in the Assessment and Plan.  Nose         Assessment & Plan   HIDRADENITIS SUPPURATIVA Inner thighs Exam: inflamed nodules and postinflammatory hyperpigmentation  Chronic and persistent condition with duration or expected duration over one year. Condition is symptomatic / bothersome to patient. Not to goal.   Hidradenitis Suppurativa is a chronic; persistent; non-curable, but treatable condition due to abnormal inflamed sweat glands in the body folds (axilla, inframammary, groin, medial thighs), causing recurrent painful draining cysts and scarring. It can be associated with severe scarring acne and cysts; also abscesses and scarring of scalp. The goal is control and prevention of flares, as it is not curable. Scars are permanent and can be thickened. Treatment may include daily use of topical medication and oral antibiotics.  Oral isotretinoin may also be helpful.  For some cases, Humira or Cosentyx (biologic injections) may be prescribed to decrease the inflammatory process and prevent flares.  When indicated, inflamed cysts may also be treated surgically.  Treatment Plan: Cont  Hibiclens wash qd  Start BP wash qd  INCREASED MELANIN nose  Exam: brown perifollicular macules on nose  Treatment Plan: Recommend Tranexamic Acid serum qd May start otc Adapalene gel qhs as tolerated  Topical retinoid medications like tretinoin/Retin-A, adapalene/Differin, tazarotene/Fabior, and Epiduo/Epiduo Forte can cause dryness and irritation when first started. Only apply a pea-sized amount to the entire affected area. Avoid applying it around the eyes, edges of mouth and creases at the nose. If you experience irritation, use a good moisturizer first and/or apply the medicine less often. If you are doing well with the medicine, you can increase how often you use it until you are applying every night. Be careful with sun protection while using this medication as it can make you sensitive to the sun. This medicine should not be used by pregnant women.    SEBORRHEIC DERMATITIS scalp Exam: Pink patches with greasy scale at scalp  Chronic and persistent condition with duration or expected duration over one year. Condition is bothersome/symptomatic for patient. Currently flared.   Seborrheic Dermatitis is a chronic persistent rash characterized by pinkness and scaling most commonly of the mid face but also can occur on the scalp (dandruff), ears; mid chest, mid back and groin.  It tends to be exacerbated by stress and cooler weather.  People who have neurologic disease may experience new onset or exacerbation of existing seborrheic dermatitis.  The condition is not curable but treatable and can be controlled.  Treatment Plan: Start Ketoconazole  2% shampoo 2x/wk to wash scalp, let sit 5 minutes and rinse out   Start Fluocinonide  sol qd/bid aa scalp prn flares  Topical  steroids (such as triamcinolone, fluocinolone, fluocinonide , mometasone, clobetasol, halobetasol, betamethasone, hydrocortisone) can cause thinning and lightening of the skin if they are used for too long in the same area.  Your physician has selected the right strength medicine for your problem and area affected on the body. Please use your medication only as directed by your physician to prevent side effects.    STRIAE axilla Exam: striae axilla  Treatment Plan: Benign-appearing.  Observation.   HIDRADENITIS SUPPURATIVA   OTHER MELANIN HYPERPIGMENTATION   SEBORRHEIC DERMATITIS   STRIAE    Return for recheck nose, HS, [place] derm.  I, Grayce Saunas, RMA, am acting as scribe for Boneta Sharps, MD .   Documentation: I have reviewed the above documentation for accuracy and completeness, and I agree with the above.  Boneta Sharps, MD

## 2024-04-14 ENCOUNTER — Encounter: Payer: Self-pay | Admitting: Dermatology

## 2024-05-07 ENCOUNTER — Other Ambulatory Visit: Payer: Self-pay

## 2024-05-07 ENCOUNTER — Ambulatory Visit: Admitting: Family Medicine

## 2024-05-07 ENCOUNTER — Encounter: Payer: Self-pay | Admitting: Family Medicine

## 2024-05-07 VITALS — BP 129/88 | HR 62 | Temp 98.8°F | Ht 62.0 in | Wt 209.5 lb

## 2024-05-07 DIAGNOSIS — K219 Gastro-esophageal reflux disease without esophagitis: Secondary | ICD-10-CM | POA: Diagnosis not present

## 2024-05-07 DIAGNOSIS — Z23 Encounter for immunization: Secondary | ICD-10-CM | POA: Diagnosis not present

## 2024-05-07 DIAGNOSIS — R030 Elevated blood-pressure reading, without diagnosis of hypertension: Secondary | ICD-10-CM | POA: Diagnosis not present

## 2024-05-07 DIAGNOSIS — L732 Hidradenitis suppurativa: Secondary | ICD-10-CM | POA: Diagnosis not present

## 2024-05-07 DIAGNOSIS — E66812 Obesity, class 2: Secondary | ICD-10-CM

## 2024-05-07 DIAGNOSIS — R0982 Postnasal drip: Secondary | ICD-10-CM | POA: Diagnosis not present

## 2024-05-07 DIAGNOSIS — E661 Drug-induced obesity: Secondary | ICD-10-CM

## 2024-05-07 DIAGNOSIS — Z6838 Body mass index (BMI) 38.0-38.9, adult: Secondary | ICD-10-CM | POA: Diagnosis not present

## 2024-05-07 MED ORDER — LORATADINE 10 MG PO TABS
10.0000 mg | ORAL_TABLET | Freq: Every day | ORAL | 6 refills | Status: AC
  Filled 2024-05-07 – 2024-07-18 (×2): qty 60, 60d supply, fill #0

## 2024-05-07 NOTE — Progress Notes (Signed)
 "   New patient visit   Patient: Summer Powers   DOB: [birth year]   33 y.o. Female  MRN: [id number] Visit Date: 05/07/2024  Today's healthcare provider: Rockie Agent, MD   Chief Complaint  Patient presents with   New Patient (Initial Visit)    Patient is here to establish care with a primary provider.  Expresses no concerns.  Flu shot.   Subjective    Summer Powers is a 33 y.o. female who presents today as a new patient to establish care.   HPI     New Patient (Initial Visit)    Additional comments: Patient is here to establish care with a primary provider.  Expresses no concerns.  Flu shot.      Last edited by Toombs, Heather  L, CMA on 05/07/2024 10:42 AM.       Discussed the use of AI scribe software for clinical note transcription with the patient, who gave verbal consent to proceed.  History of Present Illness Summer Powers is a 33 year old female who presents to establish care as a new patient.  She has a history of hidradenitis suppurativa with lesions located between her legs and knees and is currently under dermatological care. She uses ketoconazole  and an antibiotic for a yeast issue on her scalp, which has improved since September 7th, with reduced flakiness and dandruff.  She has a history of gastroesophageal reflux disease and uses Protonix  40 mg as needed, particularly when symptoms occur at night. She has tested negative for H. pylori.  She experiences chronic postnasal drip and uses Claritin  inconsistently. She had her tonsils removed due to tonsil stones and persistent halitosis despite regular dental care and hygiene practices. She uses a tongue scraper, flosses, and has recently started using TheraBreath mouthwash.  She is concerned about her weight, noting a BMI of 38 and a recent weight of 209 pounds. She has attempted to exercise, walking four miles a day for a few days, but struggles with consistency. She aims to be more consistent with  her physical activity.  She has a family history of breast cancer on her father's side, with her aunt having been diagnosed.      Past Medical History:  Diagnosis Date   Abortion    Encounter for procreative genetic counseling 08/16/2016   Formatting of this note might be different from the original. Genetic counseling visit on 08/18/2016  Aneuploidy screening/ testing: First trimester screening; results pending  Carrier screening: FOB not involved, not discussed at [facility] appt     GERD (gastroesophageal reflux disease)    Miscarriage    Wears contact lenses     Outpatient Medications Prior to Visit  Medication Sig   Cholecalciferol (VITAMIN D-3 PO) Take by mouth. (Patient not taking: Reported on 03/26/2024)   fluocinonide  (LIDEX ) 0.05 % external solution Apply 1 Application topically as directed. once to twice daily to affected area for itchy scalp as needed flares   ketoconazole  (NIZORAL ) 2 % shampoo Apply 1 Application topically 2 (two) times a week. 2 times weekly massage into scalp let sit 5 minutes and rinse out   pantoprazole  (PROTONIX ) 40 MG tablet Take 1 tablet (40 mg total) by mouth daily.   [DISCONTINUED] loratadine  (CLARITIN ) 10 MG tablet Take 10 mg by mouth daily.   No facility-administered medications prior to visit.    Past Surgical History:  Procedure Laterality Date   CYST EXCISION  2012   thumb  [facility]  Dr Claudene   HAND SURGERY Right  TONSILLECTOMY AND ADENOIDECTOMY Bilateral 11/23/2023   Procedure: TONSILLECTOMY;  Surgeon: Herminio Miu, MD;  Location: [facility];  Service: ENT;  Laterality: Bilateral;  POSSIBLE ADENOIDECTOMY   Family Status  Relation Name Status   Mother  Alive   Father  Deceased   Sister  Alive   Sister  Alive   Pat Aunt  Deceased   MGM  Deceased   MGF  Deceased   PGM  Alive   PGF  Alive   Other  (Not Specified)  No partnership data on file   Family History  Problem Relation Age of Onset   Alcohol abuse Mother     Congestive Heart Failure Father    COPD Father    Lupus Sister    Healthy Sister    Cancer Paternal Aunt    Breast cancer Paternal Aunt    Lupus Maternal Grandmother    Healthy Paternal Grandmother    Healthy Paternal Grandfather    Sleep apnea Other        Cousin   Social History   Socioeconomic History   Marital status: Single    Spouse name: Not on file   Number of children: Not on file   Years of education: Not on file   Highest education level: Not on file  Occupational History   Not on file  Tobacco Use   Smoking status: Every Day    Current packs/day: 0.25    Average packs/day: 0.3 packs/day for 0.1 years    Types: Cigarettes    Start date: 04/16/2024    Last attempt to quit: 01/12/2021   Smokeless tobacco: Never   Tobacco comments:    2-3 cigarettes a day  Vaping Use   Vaping status: Former   Start date: 01/12/2021   Substances: Nicotine, Flavoring   Devices: Doctor, General Practice  Substance and Sexual Activity   Alcohol use: Yes    Alcohol/week: 3.0 standard drinks of alcohol    Types: 3 Shots of liquor per week    Comment: weekends   Drug use: Never    Types: Marijuana   Sexual activity: Not Currently    Partners: Male    Comment: no method  Other Topics Concern   Not on file  Social History Narrative   Not on file   Social Drivers of Health   Financial Resource Strain: Not on file  Food Insecurity: Not on file  Transportation Needs: Not on file  Physical Activity: Not on file  Stress: Not on file  Social Connections: Not on file     No Known Allergies  Immunization History  Administered Date(s) Administered   DTP 08/13/1990, 11/04/1990, 01/27/1991, 07/29/1991   DTaP 02/08/1995   HIB, Unspecified 08/13/1990, 11/04/1990, 01/27/1991, 07/29/1991, 02/08/1995   HPV Quadrivalent 03/26/2006, 03/27/2008, 09/08/2008   Hpv-Unspecified 03/26/2006, 03/27/2008, 09/08/2008   Influenza, Seasonal, Injecte, Preservative Fre 05/07/2024   Influenza,inj,Quad  PF,6+ Mos 08/02/2016   MMR 07/29/1991, 02/08/1995, 04/04/1995   OPV 08/13/1990, 11/04/1990, 01/27/1991, 07/29/1991, 02/08/1995   PFIZER(Purple Top)SARS-COV-2 Vaccination 04/16/2020, 05/07/2020   Td 01/01/2017   Tdap 03/26/2006    Health Maintenance  Topic Date Due   Pneumococcal Vaccine (1 of 2 - PCV) Never done   Hepatitis B Vaccines 19-59 Average Risk (1 of 3 - 19+ 3-dose series) Never done   COVID-19 Vaccine (3 - 2025-26 season) 04/14/2024   Cervical Cancer Screening (HPV/Pap Cotest)  03/01/2026   DTaP/Tdap/Td (8 - Td or Tdap) 01/02/2027   Influenza Vaccine  Completed   HPV  VACCINES  Completed   Hepatitis C Screening  Completed   HIV Screening  Completed   Meningococcal B Vaccine  Aged Out    Patient Care Team: Sharma Coyer, MD as PCP - General (Family Medicine)  Review of Systems  Last CBC Lab Results  Component Value Date   WBC 7.8 10/18/2020   HGB 12.8 10/18/2020   HCT 40.3 10/18/2020   MCV 86.7 10/18/2020   MCH 27.5 10/18/2020   RDW 13.1 10/18/2020   PLT 222 10/18/2020   Last metabolic panel Lab Results  Component Value Date   GLUCOSE 117 (H) 10/18/2020   NA 137 10/18/2020   K 3.9 10/18/2020   CL 106 10/18/2020   CO2 23 10/18/2020   BUN 13 10/18/2020   CREATININE 0.83 10/18/2020   GFRNONAA >60 10/18/2020   CALCIUM 8.8 (L) 10/18/2020   ANIONGAP 8 10/18/2020   Last lipids No results found for: CHOL, HDL, LDLCALC, LDLDIRECT, TRIG, CHOLHDL Last hemoglobin A1c Lab Results  Component Value Date   HGBA1C 5.3 03/16/2023   Last thyroid functions No results found for: TSH, T3TOTAL, T4TOTAL, THYROIDAB Last vitamin D No results found for: 25OHVITD2, 25OHVITD3, VD25OH Last vitamin B12 and Folate No results found for: VITAMINB12, FOLATE      Objective    BP 129/88 (BP Location: Right Arm, Patient Position: Sitting, Cuff Size: Normal)   Pulse 62   Temp 98.8 F (37.1 C) (Oral)   Ht 5' 2 (1.575 m)   Wt 209 lb  8 oz (95 kg)   SpO2 100%   BMI 38.32 kg/m  BP Readings from Last 3 Encounters:  05/07/24 129/88  03/26/24 127/84  11/23/23 125/73   Wt Readings from Last 3 Encounters:  05/07/24 209 lb 8 oz (95 kg)  03/26/24 204 lb 6.4 oz (92.7 kg)  11/23/23 200 lb (90.7 kg)        Depression Screen    05/07/2024   11:51 AM 03/26/2024    3:09 PM 03/16/2023    9:29 AM 03/08/2022   10:42 AM  PHQ 2/9 Scores  PHQ - 2 Score 0 0 0 0  PHQ- 9 Score 1      No results found for any visits on 05/07/24.   Physical Exam Physical Exam VITALS: BP- 129/88 MEASUREMENTS: Height- 5'2, Weight- 209, BMI- 38.0. CHEST: Clear to auscultation bilaterally, no wheezes, rhonchi, or crackles. CARDIOVASCULAR: Normal heart rate and rhythm, S1 and S2 normal without murmurs.      Assessment & Plan      Problem List Items Addressed This Visit     Class 2 drug-induced obesity with serious comorbidity and body mass index (BMI) of 38.0 to 38.9 in adult   Obesity, class 2 Chronic BMI is 38, indicating class 2 obesity. She is 5'2 tall. Emphasized the importance of consistent physical activity and dietary modifications for weight management. - Engage in moderate intensity aerobic exercise for 30 minutes, 4-5 days a week. - Aim for a caloric intake of 1600-1800 calories per day. - Use a fitness app like Lose It or My Fitness Pal to track caloric intake and exercise. - Set a weight goal of 155-160 pounds.      Hidradenitis suppurativa   Chronic intermittent condition  Managed by dermatology       Mild acid reflux - Primary   Gastroesophageal reflux disease (GERD) Chronic  GERD symptoms are managed with lifestyle and dietary modifications. Protonix  40 mg is used as needed rather than daily. Negative for H. pylori  infection. Has been evaluated by GI - Continue using Protonix  40 mg as needed for GERD symptoms. - Take Protonix  30 minutes before meals that may trigger symptoms.      Post-nasal drip   Allergic  rhinitis with postnasal drip Chronic  Postnasal drip is managed with Claritin . Discussed the potential benefit of daily Claritin  use for symptom control. - Take Claritin  daily for postnasal drip as directed, 10mg  daily      Other Visit Diagnoses       Need for influenza vaccination       Relevant Orders   Flu vaccine trivalent PF, 6mos and older(Flulaval,Afluria,Fluarix,Fluzone) (Completed)     Elevated blood pressure reading in office without diagnosis of hypertension           Assessment and Plan Assessment & Plan   Elevated blood pressure, without diagnosis of hypertension Blood pressure readings are elevated but not at a level to initiate medication. Discussed lifestyle modifications including exercise and diet. - Monitor blood pressure at home a couple of times a week. - If blood pressure readings reach 140s/90s, discuss further management options.    .  Halitosis? Halitosis is a concern despite regular dental check-ups and oral hygiene. Negative for H. pylori infection. Discussed potential dietary influences and the importance of maintaining oral hygiene. - Continue regular dental check-ups every six months. - Consider dietary modifications to reduce halitosis. - Use TheraBreath mouthwash and a water flosser for oral hygiene.      Return in about 4 months (around 09/06/2024) for Weight MGMT.      Rockie Agent, MD  [facility] [phone] (phone) [phone] (fax)  [facility] "

## 2024-05-07 NOTE — Patient Instructions (Signed)
 To keep you healthy, please keep in mind the following health maintenance items that you are due for:   Health Maintenance Due  Topic Date Due   Pneumococcal Vaccine (1 of 2 - PCV) Never done   Hepatitis B Vaccines 19-59 Average Risk (1 of 3 - 19+ 3-dose series) Never done   COVID-19 Vaccine (3 - 2025-26 season) 04/14/2024     Best Wishes,   Dr. Lang

## 2024-05-09 DIAGNOSIS — E661 Drug-induced obesity: Secondary | ICD-10-CM | POA: Insufficient documentation

## 2024-05-09 NOTE — Assessment & Plan Note (Signed)
 Gastroesophageal reflux disease (GERD) Chronic  GERD symptoms are managed with lifestyle and dietary modifications. Protonix  40 mg is used as needed rather than daily. Negative for H. pylori infection. Has been evaluated by GI - Continue using Protonix  40 mg as needed for GERD symptoms. - Take Protonix  30 minutes before meals that may trigger symptoms.

## 2024-05-09 NOTE — Assessment & Plan Note (Signed)
 Chronic intermittent condition  Managed by dermatology

## 2024-05-09 NOTE — Assessment & Plan Note (Addendum)
 Allergic rhinitis with postnasal drip Chronic  Postnasal drip is managed with Claritin . Discussed the potential benefit of daily Claritin  use for symptom control. - Take Claritin  daily for postnasal drip as directed, 10mg  daily

## 2024-05-09 NOTE — Assessment & Plan Note (Signed)
 Obesity, class 2 Chronic BMI is 38, indicating class 2 obesity. She is 5'2 tall. Emphasized the importance of consistent physical activity and dietary modifications for weight management. - Engage in moderate intensity aerobic exercise for 30 minutes, 4-5 days a week. - Aim for a caloric intake of 1600-1800 calories per day. - Use a fitness app like Lose It or My Fitness Pal to track caloric intake and exercise. - Set a weight goal of 155-160 pounds.

## 2024-05-19 ENCOUNTER — Other Ambulatory Visit: Payer: Self-pay

## 2024-07-17 ENCOUNTER — Encounter: Payer: Self-pay | Admitting: Dermatology

## 2024-07-17 ENCOUNTER — Ambulatory Visit: Admitting: Dermatology

## 2024-07-17 ENCOUNTER — Other Ambulatory Visit: Payer: Self-pay

## 2024-07-17 DIAGNOSIS — L814 Other melanin hyperpigmentation: Secondary | ICD-10-CM | POA: Diagnosis not present

## 2024-07-17 MED ORDER — TRETINOIN 0.1 % EX CREA
TOPICAL_CREAM | Freq: Every day | CUTANEOUS | 2 refills | Status: AC
  Filled 2024-07-17: qty 20, 30d supply, fill #0

## 2024-07-17 NOTE — Progress Notes (Signed)
   Follow-Up Visit   Subjective  Summer Powers is a 33 y.o. female who presents for the following: dark spots on nose. 3 month follow up. Has been using OTC Differin twice daily. She has not noticed any improvement.   Seborrheic dermatitis on scalp has improved with ketoconazole  shampoo and fluocinonide  solution.   The following portions of the chart were reviewed this encounter and updated as appropriate: medications, allergies, medical history  Review of Systems:  No other skin or systemic complaints except as noted in HPI or Assessment and Plan.  Objective  Well appearing patient in no apparent distress; mood and affect are within normal limits.  A focused examination was performed of the following areas: Face   Relevant physical exam findings are noted in the Assessment and Plan.    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 Twice weekly and increase as tolerated. Dilute with moisturizer if too strong. Send MyChart if wanting 0.05% strength Do not recommend lightening cream due to risk of hypopigmentation of normal tone Try topical tranexamic acid daily  Discussed seeing cosmetic dermatologist to discus focused laser treatments.   Topical retinoid medications like tretinoin/Retin-A, adapalene/Differin, tazarotene/Fabior, and Epiduo/Epiduo Forte can cause dryness and irritation when first started. Only apply a pea-sized amount to the entire affected area. Avoid applying it around the eyes, edges of mouth and creases at the nose. If you experience irritation, use a good moisturizer first and/or apply the medicine less often. If you are doing well with the medicine, you can increase how often you use it until you are applying every night. Be careful with  sun protection while using this medication as it can make you sensitive to the sun. This medicine should not be used by pregnant women.      Return in about 3 months (around 10/15/2024), or if symptoms worsen or fail to improve.  I, Jill Parcell, CMA, am acting as scribe for Boneta Sharps, MD.   Documentation: I have reviewed the above documentation for accuracy and completeness, and I agree with the above.  Boneta Sharps, MD

## 2024-07-17 NOTE — Patient Instructions (Signed)
 Start Tretinoin 0.1% cream to nose at bedtime, wash off in morning.     Topical retinoid medications like tretinoin/Retin-A, adapalene/Differin, tazarotene/Fabior, and Epiduo/Epiduo Forte can cause dryness and irritation when first started. Only apply a pea-sized amount to the entire affected area. Avoid applying it around the eyes, edges of mouth and creases at the nose. If you experience irritation, use a good moisturizer first and/or apply the medicine less often. If you are doing well with the medicine, you can increase how often you use it until you are applying every night. Be careful with sun protection while using this medication as it can make you sensitive to the sun. This medicine should not be used by pregnant women.      Recommend daily broad spectrum sunscreen SPF 30+ to sun-exposed areas, reapply every 2 hours as needed. Call for new or changing lesions.  Staying in the shade or wearing long sleeves, sun glasses (UVA+UVB protection) and wide brim hats (4-inch brim around the entire circumference of the hat) are also recommended for sun protection.      Due to recent changes in healthcare laws, you may see results of your pathology and/or laboratory studies on MyChart before the doctors have had a chance to review them. We understand that in some cases there may be results that are confusing or concerning to you. Please understand that not all results are received at the same time and often the doctors may need to interpret multiple results in order to provide you with the best plan of care or course of treatment. Therefore, we ask that you please give us  2 business days to thoroughly review all your results before contacting the office for clarification. Should we see a critical lab result, you will be contacted sooner.   If You Need Anything After Your Visit  If you have any questions or concerns for your doctor, please call our main line at [phone] and press option 4 to  reach your doctor's medical assistant. If no one answers, please leave a voicemail as directed and we will return your call as soon as possible. Messages left after 4 pm will be answered the following business day.   You may also send us  a message via MyChart. We typically respond to MyChart messages within 1-2 business days.  For prescription refills, please ask your pharmacy to contact our office. Our fax number is [phone].  If you have an urgent issue when the clinic is closed that cannot wait until the next business day, you can page your doctor at the number below.    Please note that while we do our best to be available for urgent issues outside of office hours, we are not available 24/7.   If you have an urgent issue and are unable to reach us , you may choose to seek medical care at your doctor's office, retail clinic, urgent care center, or emergency room.  If you have a medical emergency, please immediately call 911 or go to the emergency department.  Pager Numbers  - Dr. Hester: [phone]  - Dr. Jackquline: [phone]  - Dr. Claudene: [phone]   - Dr. Raymund: [phone]  In the event of inclement weather, please call our main line at [phone] for an update on the status of any delays or closures.  Dermatology Medication Tips: Please keep the boxes that topical medications come in in order to help keep track of the instructions about where and how to use these. Pharmacies typically print the medication  instructions only on the boxes and not directly on the medication tubes.   If your medication is too expensive, please contact our office at [phone] option 4 or send us  a message through MyChart.   We are unable to tell what your co-pay for medications will be in advance as this is different depending on your insurance coverage. However, we may be able to find a substitute medication at lower cost or fill out paperwork to get insurance to cover a needed  medication.   If a prior authorization is required to get your medication covered by your insurance company, please allow us  1-2 business days to complete this process.  Drug prices often vary depending on where the prescription is filled and some pharmacies may offer cheaper prices.  The website www.goodrx.com contains coupons for medications through different pharmacies. The prices here do not account for what the cost may be with help from insurance (it may be cheaper with your insurance), but the website can give you the price if you did not use any insurance.  - You can print the associated coupon and take it with your prescription to the pharmacy.  - You may also stop by our office during regular business hours and pick up a GoodRx coupon card.  - If you need your prescription sent electronically to a different pharmacy, notify our office through [facility] or by phone at [phone] option 4.     Si Usted Necesita Algo Despus de Su Visita  Tambin puede enviarnos un mensaje a travs de Clinical Cytogeneticist. Por lo general respondemos a los mensajes de MyChart en el transcurso de 1 a 2 das hbiles.  Para renovar recetas, por favor pida a su farmacia que se ponga en contacto con nuestra oficina. Randi lakes de fax es [place] [phone].  Si tiene un asunto urgente cuando la clnica est cerrada y que no puede esperar hasta el siguiente da hbil, puede llamar/localizar a su doctor(a) al nmero que aparece a continuacin.   Por favor, tenga en cuenta que aunque hacemos todo lo posible para estar disponibles para asuntos urgentes fuera del horario de [place], no estamos disponibles las 24 horas del da, los 7 [address]  [address] de la [place].   Si tiene un problema urgente y no puede comunicarse con nosotros, puede optar por buscar atencin mdica  en el consultorio de su doctor(a), en una clnica privada, en un centro de atencin urgente o en una sala de emergencias.  Si tiene engineer, drilling,  por favor llame inmediatamente al 911 o vaya a la sala de emergencias.  Nmeros de bper  - Dr. Hester: [phone]  - Dra. Jackquline: [phone]  - Dr. Claudene: [phone]  - Dra. Kitts: [phone]  En caso de inclemencias del [place], por favor llame a nuestra lnea principal al [phone] para una actualizacin sobre el estado de cualquier retraso o cierre.  Consejos para la medicacin en dermatologa: Por favor, guarde las cajas en las que vienen los medicamentos de uso tpico para ayudarle a seguir las instrucciones sobre dnde y cmo usarlos. Las farmacias generalmente imprimen las instrucciones del medicamento slo en las cajas y no directamente en los tubos del [place].   Si su medicamento es muy caro, por favor, pngase en contacto con landry rieger llamando al [phone] y presione la opcin 4 o envenos un mensaje a travs de Clinical Cytogeneticist.   No podemos decirle cul ser su copago por los medicamentos por adelantado ya que esto es diferente dependiendo de la oncologist  de su seguro. Sin embargo, es posible que podamos encontrar un medicamento sustituto a audiological scientist un formulario para que el seguro cubra el medicamento que se considera necesario.   Si se requiere una autorizacin previa para que su compaa de seguros cubra su medicamento, por favor permtanos de 1 a 2 das hbiles para completar este proceso.  Los precios de los medicamentos varan con frecuencia dependiendo del environmental consultant de dnde se surte la receta y alguna farmacias pueden ofrecer precios ms baratos.  El sitio web www.goodrx.com tiene cupones para medicamentos de health and safety inspector. Los precios aqu no tienen en cuenta lo que podra costar con la ayuda del seguro (puede ser ms barato con su seguro), pero el sitio web puede darle el precio si no utiliz tourist information centre manager.  - Puede imprimir el cupn correspondiente y llevarlo con su receta a la farmacia.  - Tambin puede pasar por nuestra oficina  durante el horario de atencin regular y education officer, museum una tarjeta de cupones de GoodRx.  - Si necesita que su receta se enve electrnicamente a una farmacia diferente, informe a nuestra oficina a travs de MyChart de [place] o por telfono llamando al [phone] y presione la opcin 4.

## 2024-07-18 ENCOUNTER — Other Ambulatory Visit: Payer: Self-pay

## 2024-07-24 ENCOUNTER — Other Ambulatory Visit: Payer: Self-pay | Admitting: Physician Assistant

## 2024-07-24 DIAGNOSIS — K219 Gastro-esophageal reflux disease without esophagitis: Secondary | ICD-10-CM

## 2024-07-25 ENCOUNTER — Other Ambulatory Visit: Payer: Self-pay

## 2024-07-28 ENCOUNTER — Telehealth: Payer: Self-pay | Admitting: Gastroenterology

## 2024-07-28 ENCOUNTER — Other Ambulatory Visit: Payer: Self-pay | Admitting: Physician Assistant

## 2024-07-28 ENCOUNTER — Other Ambulatory Visit: Payer: Self-pay

## 2024-07-28 DIAGNOSIS — K219 Gastro-esophageal reflux disease without esophagitis: Secondary | ICD-10-CM

## 2024-07-28 NOTE — Telephone Encounter (Signed)
 Good morning Summer Powers,   I received call from this patient stating that she would like to be seen in our office with you and get a refill on her medication. Patient stated that she is not able to come to [place] and would like  to know if you are able to do a virtual visit with her. Patient was last seen with you June 27 th,2024 with [facility]I. Would you please advise on how to schedule this patient.   Thank you.

## 2024-07-29 ENCOUNTER — Other Ambulatory Visit: Payer: Self-pay

## 2024-07-29 ENCOUNTER — Other Ambulatory Visit: Payer: Self-pay | Admitting: Family Medicine

## 2024-07-29 ENCOUNTER — Encounter: Payer: Self-pay | Admitting: Family Medicine

## 2024-07-29 DIAGNOSIS — K219 Gastro-esophageal reflux disease without esophagitis: Secondary | ICD-10-CM

## 2024-07-29 MED FILL — Pantoprazole Sodium EC Tab 40 MG (Base Equiv): ORAL | 90 days supply | Qty: 90 | Fill #0 | Status: AC

## 2024-07-29 NOTE — Telephone Encounter (Signed)
 Patient has been made aware of recommendations.

## 2024-07-29 NOTE — Telephone Encounter (Signed)
 See refill encounter. Per protocol filled for 90 days plus 1 refill

## 2024-07-30 ENCOUNTER — Other Ambulatory Visit: Payer: Self-pay

## 2024-08-11 ENCOUNTER — Ambulatory Visit: Admitting: Family Medicine

## 2024-09-08 NOTE — Progress Notes (Unsigned)
 "   09/08/2024 Summer Powers [id number] [birth year]  Gastroenterology Office Note    Referring Provider: Sharma Coyer, MD Primary Care Physician:  Sharma Coyer, MD  Primary GI Provider: Celestia Rima, NP; Jinny Carmine, MD    Chief Complaint   No chief complaint on file.    History of Present Illness   Mellissa Powers is a 34 y.o. female with PMHX of *** , presenting today at the request of Simmons-Robinson, Makiera, MD due to  Past seen by Summer Console, PA on 02/08/2023 for GERD. Initiated pantoprazole  40 mg once daiy. C/o of halitosis, H. Pylori negative.   Past Medical History:  Diagnosis Date   Abortion    Encounter for procreative genetic counseling 08/16/2016   Formatting of this note might be different from the original. Genetic counseling visit on 08/18/2016  Aneuploidy screening/ testing: First trimester screening; results pending  Carrier screening: FOB not involved, not discussed at [facility] appt     GERD (gastroesophageal reflux disease)    Miscarriage    Wears contact lenses     Past Surgical History:  Procedure Laterality Date   CYST EXCISION  2012   thumb  [facility]  Dr Claudene   HAND SURGERY Right    TONSILLECTOMY AND ADENOIDECTOMY Bilateral 11/23/2023   Procedure: TONSILLECTOMY;  Surgeon: Herminio Miu, MD;  [facility]: [facility];  Service: ENT;  Laterality: Bilateral;  POSSIBLE ADENOIDECTOMY    Current Outpatient Medications  Medication Sig Dispense Refill   Cholecalciferol (VITAMIN D-3 PO) Take by mouth.     fluocinonide  (LIDEX ) 0.05 % external solution Apply 1 Application topically as directed. once to twice daily to affected area for itchy scalp as needed flares 60 mL 3   ketoconazole  (NIZORAL ) 2 % shampoo Apply 1 Application topically 2 (two) times a week. 2 times weekly massage into scalp let sit 5 minutes and rinse out 120 mL 3   loratadine  (CLARITIN ) 10 MG tablet Take 1 tablet (10 mg total) by mouth daily. 60 tablet 6    pantoprazole  (PROTONIX ) 40 MG tablet Take 1 tablet (40 mg total) by mouth daily. 90 tablet 1   tretinoin  (RETIN-A ) 0.1 % cream Apply topically at bedtime. 20 g 2   No current facility-administered medications for this visit.    Allergies as of 09/09/2024   (No Known Allergies)    Family History  Problem Relation Age of Onset   Alcohol abuse Mother    Congestive Heart Failure Father    COPD Father    Lupus Sister    Healthy Sister    Cancer Paternal Aunt    Breast cancer Paternal Aunt    Lupus Maternal Grandmother    Healthy Paternal Grandmother    Healthy Paternal Grandfather    Sleep apnea Other        Cousin    Social History   Socioeconomic History   Marital status: Single    Spouse name: Not on file   Number of children: Not on file   Years of education: Not on file   Highest education level: Not on file  Occupational History   Not on file  Tobacco Use   Smoking status: Every Day    Current packs/day: 0.25    Average packs/day: 0.3 packs/day for 0.4 years (0.1 ttl pk-yrs)    Types: Cigarettes    Start date: 04/16/2024    Last attempt to quit: 01/12/2021   Smokeless tobacco: Never   Tobacco comments:    2-3 cigarettes a day  Vaping  Use   Vaping status: Former   Start date: 01/12/2021   Substances: Nicotine, Flavoring   Devices: Doctor, General Practice  Substance and Sexual Activity   Alcohol use: Yes    Alcohol/week: 3.0 standard drinks of alcohol    Types: 3 Shots of liquor per week    Comment: weekends   Drug use: Never    Types: Marijuana   Sexual activity: Not Currently    Partners: Male    Comment: no method  Other Topics Concern   Not on file  Social History Narrative   Not on file   Social Drivers of Health   Tobacco Use: High Risk (07/17/2024)   Patient History    Smoking Tobacco Use: Every Day    Smokeless Tobacco Use: Never    Passive Exposure: Not on file  Financial Resource Strain: Not on file  Food Insecurity: Not on file   Transportation Needs: Not on file  Physical Activity: Not on file  Stress: Not on file  Social Connections: Not on file  Intimate Partner Violence: Not At Risk (03/26/2024)   Epic    Fear of Current or Ex-Partner: No    Emotionally Abused: No    Physically Abused: No    Sexually Abused: No  Depression (PHQ2-9): Low Risk (05/07/2024)   Depression (PHQ2-9)    PHQ-2 Score: 1  Alcohol Screen: Not on file  Housing: Not on file  Utilities: Not on file  Health Literacy: Not on file     RELEVANT GI HISTORY, IMAGING AND LABS: CBC    Component Value Date/Time   WBC 7.8 10/18/2020 0915   RBC 4.65 10/18/2020 0915   HGB 12.8 10/18/2020 0915   HGB 13.0 10/12/2017 1505   HCT 40.3 10/18/2020 0915   HCT 39.8 10/12/2017 1505   PLT 222 10/18/2020 0915   PLT 202 10/12/2017 1505   MCV 86.7 10/18/2020 0915   MCV 86 10/12/2017 1505   MCV 88 09/04/2013 1309   MCH 27.5 10/18/2020 0915   MCHC 31.8 10/18/2020 0915   RDW 13.1 10/18/2020 0915   RDW 13.1 10/12/2017 1505   RDW 12.9 09/04/2013 1309   LYMPHSABS 2.3 09/04/2013 1309   MONOABS 0.7 09/04/2013 1309   EOSABS 0.1 09/04/2013 1309   BASOSABS 0.1 09/04/2013 1309   No results for input(s): HGB in the last 8760 hours.  CMP     Component Value Date/Time   NA 137 10/18/2020 0915   K 3.9 10/18/2020 0915   CL 106 10/18/2020 0915   CO2 23 10/18/2020 0915   GLUCOSE 117 (H) 10/18/2020 0915   BUN 13 10/18/2020 0915   CREATININE 0.83 10/18/2020 0915   CALCIUM 8.8 (L) 10/18/2020 0915   GFRNONAA >60 10/18/2020 0915       No data to display            Review of Systems   All systems reviewed and negative except where noted in HPI.    Physical Exam  There were no vitals taken for this visit. No LMP recorded. General:   Alert and oriented. Pleasant and cooperative. Well-nourished and well-developed. In no acute distress.  Head:  Normocephalic and atraumatic. Eyes:  Without icterus Ears:  Normal auditory acuity. Neck:  Supple;  no masses or thyromegaly. Lungs:  Respirations even and unlabored.  Clear throughout to auscultation.   No wheezes, crackles, or rhonchi. No acute distress. Heart:  Regular rate and rhythm; no murmurs, clicks, rubs, or gallops. Abdomen:  Normal bowel sounds.  No  bruits.  Soft, non-tender and non-distended without masses, hepatosplenomegaly or hernias noted.  No guarding or rebound tenderness.  ***Negative Carnett sign.   Rectal:  Deferred.***  Msk:  Symmetrical without gross deformities. Normal posture. Extremities:  Without edema. Neurologic:  Alert and  oriented x4;  grossly normal neurologically. Skin:  Intact without significant lesions or rashes. Psych:  Alert and cooperative. Normal mood and affect.   Assessment & Plan   Brendaliz Kuk is a 34 y.o. female presenting today with    I discussed the assessment and treatment plan with the patient. The patient was provided an opportunity to ask questions and all were answered. The patient agreed with the plan and demonstrated an understanding of the instructions.   The patient was advised to call back or seek an in-person evaluation if the symptoms worsen or if the condition fails to improve as anticipated.  Grayce Bohr, DNP, AGNP-C [facility]  "

## 2024-09-09 ENCOUNTER — Ambulatory Visit: Admitting: Family Medicine

## 2024-10-13 ENCOUNTER — Ambulatory Visit: Admitting: Family Medicine

## 2024-10-14 ENCOUNTER — Ambulatory Visit: Admitting: Family Medicine

## 2024-10-30 ENCOUNTER — Ambulatory Visit: Admitting: Family Medicine

## 2024-11-06 ENCOUNTER — Ambulatory Visit: Admitting: Family Medicine
